# Patient Record
Sex: Female | Born: 1985 | Race: White | Hispanic: No | Marital: Single | Smoking: Never smoker
Health system: Southern US, Community
[De-identification: ages and names within clinical notes are randomized; demographics above are authoritative.]

## PROBLEM LIST (undated history)

## (undated) DIAGNOSIS — G809 Cerebral palsy, unspecified: Secondary | ICD-10-CM

## (undated) DIAGNOSIS — I10 Essential (primary) hypertension: Secondary | ICD-10-CM

## (undated) HISTORY — PX: BACK SURGERY: SHX140

---

## 2007-02-10 ENCOUNTER — Emergency Department: Payer: Self-pay | Admitting: Emergency Medicine

## 2007-08-16 ENCOUNTER — Emergency Department: Payer: Self-pay | Admitting: Emergency Medicine

## 2007-08-16 ENCOUNTER — Other Ambulatory Visit: Payer: Self-pay

## 2008-07-10 ENCOUNTER — Emergency Department: Payer: Self-pay | Admitting: Emergency Medicine

## 2008-10-03 ENCOUNTER — Encounter: Payer: Self-pay | Admitting: Internal Medicine

## 2008-10-25 ENCOUNTER — Encounter: Payer: Self-pay | Admitting: Internal Medicine

## 2008-11-25 ENCOUNTER — Encounter: Payer: Self-pay | Admitting: Internal Medicine

## 2010-08-18 ENCOUNTER — Emergency Department (HOSPITAL_COMMUNITY): Payer: Medicaid Other

## 2010-08-18 ENCOUNTER — Emergency Department (HOSPITAL_COMMUNITY)
Admission: EM | Admit: 2010-08-18 | Discharge: 2010-08-18 | Disposition: A | Discharge status: Home / Self Care | Payer: Medicaid Other | Attending: Emergency Medicine | Admitting: Emergency Medicine

## 2010-08-18 DIAGNOSIS — G809 Cerebral palsy, unspecified: Secondary | ICD-10-CM | POA: Insufficient documentation

## 2010-08-18 DIAGNOSIS — Y9289 Other specified places as the place of occurrence of the external cause: Secondary | ICD-10-CM | POA: Insufficient documentation

## 2010-08-18 DIAGNOSIS — S0990XA Unspecified injury of head, initial encounter: Secondary | ICD-10-CM | POA: Insufficient documentation

## 2010-08-18 DIAGNOSIS — M542 Cervicalgia: Secondary | ICD-10-CM | POA: Insufficient documentation

## 2010-08-18 DIAGNOSIS — R51 Headache: Secondary | ICD-10-CM | POA: Insufficient documentation

## 2010-08-18 DIAGNOSIS — W19XXXA Unspecified fall, initial encounter: Secondary | ICD-10-CM | POA: Insufficient documentation

## 2014-03-07 ENCOUNTER — Emergency Department: Payer: Self-pay | Admitting: Internal Medicine

## 2014-11-17 ENCOUNTER — Emergency Department
Admission: EM | Admit: 2014-11-17 | Discharge: 2014-11-17 | Disposition: A | Discharge status: Home / Self Care | Payer: Medicaid Other | Attending: Emergency Medicine | Admitting: Emergency Medicine

## 2014-11-17 DIAGNOSIS — R0981 Nasal congestion: Secondary | ICD-10-CM | POA: Diagnosis present

## 2014-11-17 DIAGNOSIS — J0100 Acute maxillary sinusitis, unspecified: Secondary | ICD-10-CM

## 2014-11-17 DIAGNOSIS — J029 Acute pharyngitis, unspecified: Secondary | ICD-10-CM | POA: Diagnosis not present

## 2014-11-17 DIAGNOSIS — H9203 Otalgia, bilateral: Secondary | ICD-10-CM | POA: Insufficient documentation

## 2014-11-17 MED ORDER — AMOXICILLIN-POT CLAVULANATE 600-42.9 MG/5ML PO SUSR: 30.0000 mg/kg/d | Freq: Two times a day (BID) | ORAL | Status: DC

## 2014-11-17 NOTE — Discharge Instructions (Signed)
Take medication as prescribed. Take over-the-counter Tylenol or ibuprofen as needed for pain or fever. Drink plenty of water.  Follow up with your primary care physician this week.  Return to the ER for new or worsening concerns  Pharyngitis Pharyngitis is redness, pain, and swelling (inflammation) of your pharynx.  CAUSES  Pharyngitis is usually caused by infection. Most of the time, these infections are from viruses (viral) and are part of a cold. However, sometimes pharyngitis is caused by bacteria (bacterial). Pharyngitis can also be caused by allergies. Viral pharyngitis may be spread from person to person by coughing, sneezing, and personal items or utensils (cups, forks, spoons, toothbrushes). Bacterial pharyngitis may be spread from person to person by more intimate contact, such as kissing.  SIGNS AND SYMPTOMS  Symptoms of pharyngitis include:   Sore throat.   Tiredness (fatigue).   Low-grade fever.   Headache.  Joint pain and muscle aches.  Skin rashes.  Swollen lymph nodes.  Plaque-like film on throat or tonsils (often seen with bacterial pharyngitis). DIAGNOSIS  Your health care provider will ask you questions about your illness and your symptoms. Your medical history, along with a physical exam, is often all that is needed to diagnose pharyngitis. Sometimes, a rapid strep test is done. Other lab tests may also be done, depending on the suspected cause.  TREATMENT  Viral pharyngitis will usually get better in 3-4 days without the use of medicine. Bacterial pharyngitis is treated with medicines that kill germs (antibiotics).  HOME CARE INSTRUCTIONS   Drink enough water and fluids to keep your urine clear or pale yellow.   Only take over-the-counter or prescription medicines as directed by your health care provider:   If you are prescribed antibiotics, make sure you finish them even if you start to feel better.   Do not take aspirin.   Get lots of rest.    Gargle with 8 oz of salt water ( tsp of salt per 1 qt of water) as often as every 1-2 hours to soothe your throat.   Throat lozenges (if you are not at risk for choking) or sprays may be used to soothe your throat. SEEK MEDICAL CARE IF:   You have large, tender lumps in your neck.  You have a rash.  You cough up green, yellow-brown, or bloody spit. SEEK IMMEDIATE MEDICAL CARE IF:   Your neck becomes stiff.  You drool or are unable to swallow liquids.  You vomit or are unable to keep medicines or liquids down.  You have severe pain that does not go away with the use of recommended medicines.  You have trouble breathing (not caused by a stuffy nose). MAKE SURE YOU:   Understand these instructions.  Will watch your condition.  Will get help right away if you are not doing well or get worse. Document Released: 06/13/2005 Document Revised: 04/03/2013 Document Reviewed: 02/18/2013 [facility] Patient Information 2015 [place], [place]. This information is not intended to replace advice given to you by your health care provider. Make sure you discuss any questions you have with your health care provider.  Sinusitis Sinusitis is redness, soreness, and inflammation of the paranasal sinuses. Paranasal sinuses are air pockets within the bones of your face (beneath the eyes, the middle of the forehead, or above the eyes). In healthy paranasal sinuses, mucus is able to drain out, and air is able to circulate through them by way of your nose. However, when your paranasal sinuses are inflamed, mucus and air can become  trapped. This can allow bacteria and other germs to grow and cause infection. Sinusitis can develop quickly and last only a short time (acute) or continue over a long period (chronic). Sinusitis that lasts for more than 12 weeks is considered chronic.  CAUSES  Causes of sinusitis include:  Allergies.  Structural abnormalities, such as displacement of the cartilage that  separates your nostrils (deviated septum), which can decrease the air flow through your nose and sinuses and affect sinus drainage.  Functional abnormalities, such as when the small hairs (cilia) that line your sinuses and help remove mucus do not work properly or are not present. SIGNS AND SYMPTOMS  Symptoms of acute and chronic sinusitis are the same. The primary symptoms are pain and pressure around the affected sinuses. Other symptoms include:  Upper toothache.  Earache.  Headache.  Bad breath.  Decreased sense of smell and taste.  A cough, which worsens when you are lying flat.  Fatigue.  Fever.  Thick drainage from your nose, which often is green and may contain pus (purulent).  Swelling and warmth over the affected sinuses. DIAGNOSIS  Your health care provider will perform a physical exam. During the exam, your health care provider may:  Look in your nose for signs of abnormal growths in your nostrils (nasal polyps).  Tap over the affected sinus to check for signs of infection.  View the inside of your sinuses (endoscopy) using an imaging device that has a light attached (endoscope). If your health care provider suspects that you have chronic sinusitis, one or more of the following tests may be recommended:  Allergy tests.  Nasal culture. A sample of mucus is taken from your nose, sent to a lab, and screened for bacteria.  Nasal cytology. A sample of mucus is taken from your nose and examined by your health care provider to determine if your sinusitis is related to an allergy. TREATMENT  Most cases of acute sinusitis are related to a viral infection and will resolve on their own within 10 days. Sometimes medicines are prescribed to help relieve symptoms (pain medicine, decongestants, nasal steroid sprays, or saline sprays).  However, for sinusitis related to a bacterial infection, your health care provider will prescribe antibiotic medicines. These are medicines that  will help kill the bacteria causing the infection.  Rarely, sinusitis is caused by a fungal infection. In theses cases, your health care provider will prescribe antifungal medicine. For some cases of chronic sinusitis, surgery is needed. Generally, these are cases in which sinusitis recurs more than 3 times per year, despite other treatments. HOME CARE INSTRUCTIONS   Drink plenty of water. Water helps thin the mucus so your sinuses can drain more easily.  Use a humidifier.  Inhale steam 3 to 4 times a day (for example, sit in the bathroom with the shower running).  Apply a warm, moist washcloth to your face 3 to 4 times a day, or as directed by your health care provider.  Use saline nasal sprays to help moisten and clean your sinuses.  Take medicines only as directed by your health care provider.  If you were prescribed either an antibiotic or antifungal medicine, finish it all even if you start to feel better. SEEK IMMEDIATE MEDICAL CARE IF:  You have increasing pain or severe headaches.  You have nausea, vomiting, or drowsiness.  You have swelling around your face.  You have vision problems.  You have a stiff neck.  You have difficulty breathing. MAKE SURE YOU:  Understand these instructions.  Will watch your condition.  Will get help right away if you are not doing well or get worse. Document Released: 06/13/2005 Document Revised: 10/28/2013 Document Reviewed: 06/28/2011 [facility] Patient Information 2015 [place], [place]. This information is not intended to replace advice given to you by your health care provider. Make sure you discuss any questions you have with your health care provider.

## 2014-11-17 NOTE — ED Provider Notes (Signed)
[facility] Department Provider Note ____________________________________________  Time seen: Approximately 6:27 PM  I have reviewed the triage vital signs and the nursing notes.   HISTORY  Chief Complaint URI   HPI Robin Hughes is a 29 y.o. female presents to the ER with mother at bedside for the complaints of runny nose and congestion for 2 weeks. Patient states that she's also has bilateral ear pain and sore throat for 2-3 days. Patient states that she has a lot of green nasal discharge. Reports sinus pressure and pain. Denies fever. Reports mild intermittent cough. Denies shortness of breath or chest pain. Reports continues to eat and drink well. Denies abdominal pain. Denies nausea or vomiting, diarrhea. Reports niece recently with strep throat.   Medical History: Cerebral Palsy  PCP: [facility]  There are no active problems to display for this patient.  No past surgical history on file.  No current outpatient prescriptions on file.  Allergies Review of patient's allergies indicates no known allergies.  No family history on file.  Social History History  Substance Use Topics  . Smoking status: Not on file  . Smokeless tobacco: Not on file  . Alcohol Use: Not on file    Review of Systems Constitutional: No fever/chills Eyes: No visual changes. ENT: positive for sore throat. Cardiovascular: Denies chest pain. Respiratory: Denies shortness of breath. Gastrointestinal: No abdominal pain.  No nausea, no vomiting.  No diarrhea.  No constipation. Genitourinary: Negative for dysuria. Musculoskeletal: Negative for back pain. Skin: Negative for rash. Neurological: Negative for headaches, focal weakness or numbness.  10-point ROS otherwise negative.  ____________________________________________   PHYSICAL EXAM:  VITAL SIGNS: ED Triage Vitals  Enc Vitals Group     BP 11/17/14 1612 116/85 mmHg     Pulse Rate 11/17/14 1612 69   Resp 11/17/14 1612 20     Temp 11/17/14 1612 98.2 F (36.8 C)     Temp Source 11/17/14 1612 Oral     SpO2 11/17/14 1612 100 %     Weight 11/17/14 1612 120 lb (54.432 kg)     Height 11/17/14 1612 4\' 9"  (1.448 m)     Head Cir --      Peak Flow --      Pain Score 11/17/14 1613 8     Pain Loc --      Pain Edu? --      Excl. in GC? --     Constitutional: Alert and oriented. Well appearing and in no acute distress. Eyes: Conjunctivae are normal. PERRL. EOMI. Head: ColumbusDryCleaner.[email or web address].mild to mod TTP maxillary and frontal sinus  Ears: No erythema. Bilateral TMs normal. No exudate. Nose:rhinorrhea, mucosal inflammation Mouth/Throat: Mucous membranes are moist.  Mild pharyngeal erythema with mild exudate bilaterally. Mild tonsillar swelling. NO uvular shift or deviation. Neck: No stridor.  No cervical spine tenderness to palpation. Hematological/Lymphatic/Immunilogical: mild cervical lymphadenopathy. Cardiovascular: Normal rate, regular rhythm. Grossly normal heart sounds.  Good peripheral circulation. Respiratory: Normal respiratory effort.  No retractions. Lungs CTAB. Gastrointestinal: Soft and nontender. No distention. No abdominal bruits. No CVA tenderness. Musculoskeletal: No lower extremity tenderness nor edema.  No joint effusions. Neurologic:  Normal speech and language. No gross focal neurologic deficits are appreciated. Speech is normal. No gait instability. Skin:  Skin is warm, dry and intact. No rash noted. Psychiatric: Mood and affect are normal. Speech and behavior are normal.  ____________________________________________   ________________________________________   INITIAL IMPRESSION / ASSESSMENT AND PLAN / ED COURSE  Pertinent labs & imaging  results that were available during my care of the patient were reviewed by me and considered in my medical decision making (see chart for details).  No acute distress. Very well-appearing patient. Reports sinus congestion nasal discharge 2  weeks now with also sore throat and bilateral ear pain. Suspect bacterial sinusitis and streptococcal pharyngitis. Recent exposure to strep throat from niece. Will treat with oral Augmentin. Patient to follow with the primary care physician this week. Discussed and drink plenty of fluids. Discussed follow-up and return parameters. Patient and mother agreed to plan. ____________________________________________   FINAL CLINICAL IMPRESSION(S) / ED DIAGNOSES  Final diagnoses:  Acute maxillary sinusitis, recurrence not specified  Pharyngitis     Renford Dills, NP 11/17/14 1841  Minna Antis, MD 11/17/14 2356

## 2014-11-17 NOTE — ED Notes (Signed)
Cold sx;'s for 2 weeks

## 2015-11-27 ENCOUNTER — Emergency Department
Admission: EM | Admit: 2015-11-27 | Discharge: 2015-11-27 | Disposition: A | Discharge status: Home / Self Care | Payer: Medicaid Other | Attending: Emergency Medicine | Admitting: Emergency Medicine

## 2015-11-27 ENCOUNTER — Emergency Department: Payer: Medicaid Other

## 2015-11-27 ENCOUNTER — Encounter: Payer: Self-pay | Admitting: Emergency Medicine

## 2015-11-27 DIAGNOSIS — Y9389 Activity, other specified: Secondary | ICD-10-CM | POA: Diagnosis not present

## 2015-11-27 DIAGNOSIS — Y990 Civilian activity done for income or pay: Secondary | ICD-10-CM | POA: Diagnosis not present

## 2015-11-27 DIAGNOSIS — M25562 Pain in left knee: Secondary | ICD-10-CM | POA: Diagnosis present

## 2015-11-27 DIAGNOSIS — S81032A Puncture wound without foreign body, left knee, initial encounter: Secondary | ICD-10-CM | POA: Insufficient documentation

## 2015-11-27 DIAGNOSIS — W228XXA Striking against or struck by other objects, initial encounter: Secondary | ICD-10-CM | POA: Insufficient documentation

## 2015-11-27 DIAGNOSIS — G809 Cerebral palsy, unspecified: Secondary | ICD-10-CM | POA: Insufficient documentation

## 2015-11-27 DIAGNOSIS — Y92019 Unspecified place in single-family (private) house as the place of occurrence of the external cause: Secondary | ICD-10-CM | POA: Diagnosis not present

## 2015-11-27 DIAGNOSIS — G2581 Restless legs syndrome: Secondary | ICD-10-CM | POA: Insufficient documentation

## 2015-11-27 MED ORDER — HYDROCODONE-ACETAMINOPHEN 5-325 MG PO TABS
1.0000 | ORAL_TABLET | Freq: Once | ORAL | Status: AC
  Administered 2015-11-27: 1 via ORAL
  Filled 2015-11-27: qty 1

## 2015-11-27 MED ORDER — AMOXICILLIN-POT CLAVULANATE 600-42.9 MG/5ML PO SUSR: 30.0000 mg/kg/d | Freq: Two times a day (BID) | ORAL | Status: DC

## 2015-11-27 MED ORDER — FLUCONAZOLE 150 MG PO TABS: 150.0000 mg | ORAL_TABLET | Freq: Every day | ORAL | Status: DC

## 2015-11-27 MED ORDER — HYDROCODONE-ACETAMINOPHEN 7.5-325 MG/15ML PO SOLN: 10.0000 mL | Freq: Four times a day (QID) | ORAL | Status: DC | PRN

## 2015-11-27 NOTE — Discharge Instructions (Signed)
Puncture Wound A puncture wound is an injury that is caused by a sharp, thin object that goes through (penetrates) your skin. Usually, a puncture wound does not leave a large opening in your skin, so it may not bleed a lot. However, when you get a puncture wound, dirt or other materials (foreign bodies) can be forced into your wound and break off inside. This increases the chance of infection, such as tetanus. CAUSES Puncture wounds are caused by any sharp, thin object that goes through your skin, such as:  Animal teeth, as with an animal bite.  Sharp, pointed objects, such as nails, splinters of glass, fishhooks, and needles. SYMPTOMS Symptoms of a puncture wound include:  Pain.  Bleeding.  Swelling.  Bruising.  Fluid leaking from the wound.  Numbness, tingling, or loss of function. DIAGNOSIS This condition is diagnosed with a medical history and physical exam. Your wound will be checked to see if it contains any foreign bodies. You may also have X-rays or other imaging tests. TREATMENT Treatment for a puncture wound depends on how serious the wound is. It also depends on whether the wound contains any foreign bodies. Treatment for all types of puncture wounds usually starts with:  Controlling the bleeding.  Washing out the wound with a germ-free (sterile) salt-water solution.  Checking the wound for foreign bodies. Treatment may also include:  Having the wound opened surgically to remove a foreign object.  Closing the wound with stitches (sutures) if it continues to bleed.  Covering the wound with antibiotic ointments and a bandage (dressing).  Receiving a tetanus shot.  Receiving a rabies vaccine. HOME CARE INSTRUCTIONS Medicines  Take or apply over-the-counter and prescription medicines only as told by your health care provider.  If you were prescribed an antibiotic, take or apply it as told by your health care provider. Do not stop using the antibiotic even if  your condition improves. Wound Care  There are many ways to close and cover a wound. For example, a wound can be covered with sutures, skin glue, or adhesive strips. Follow instructions from your health care provider about:  How to take care of your wound.  When and how you should change your dressing.  When you should remove your dressing.  Removing whatever was used to close your wound.  Keep the dressing dry as told by your health care provider. Do not take baths, swim, use a hot tub, or do anything that would put your wound underwater until your health care provider approves.  Clean the wound as told by your health care provider.  Do not scratch or pick at the wound.  Check your wound every day for signs of infection. Watch for:  Redness, swelling, or pain.  Fluid, blood, or pus. General Instructions  Raise (elevate) the injured area above the level of your heart while you are sitting or lying down.  If your puncture wound is in your foot, ask your health care provider if you need to avoid putting weight on your foot and for how long.  Keep all follow-up visits as told by your health care provider. This is important. SEEK MEDICAL CARE IF:  You received a tetanus shot and you have swelling, severe pain, redness, or bleeding at the injection site.  You have a fever.  Your sutures come out.  You notice a bad smell coming from your wound or your dressing.  You notice something coming out of your wound, such as wood or glass.  Your   pain is not controlled with medicine.  You have increased redness, swelling, or pain at the site of your wound.  You have fluid, blood, or pus coming from your wound.  You notice a change in the color of your skin near your wound.  You need to change the dressing frequently due to fluid, blood, or pus draining from your wound.  You develop a new rash.  You develop numbness around your wound. SEEK IMMEDIATE MEDICAL CARE IF:  You  develop severe swelling around your wound.  Your pain suddenly increases and is severe.  You develop painful skin lumps.  You have a red streak going away from your wound.  The wound is on your hand or foot and you cannot properly move a finger or toe.  The wound is on your hand or foot and you notice that your fingers or toes look pale or bluish.   This information is not intended to replace advice given to you by your health care provider. Make sure you discuss any questions you have with your health care provider.   Document Released: 03/23/2005 Document Revised: 03/04/2015 Document Reviewed: 08/06/2014 Elsevier Interactive Patient Education 2016 [employer]  

## 2015-11-27 NOTE — ED Provider Notes (Signed)
[facility] Emergency Department Provider Note ____________________________________________  Time seen: Approximately 6:03 PM  I have reviewed the triage vital signs and the nursing notes.   HISTORY  Chief Complaint Knee Pain    HPI Robin Hughes is a 30 y.o. female who presents to the emergency department for evaluation of left knee pain. Due to cerebral palsy, she crawls most of the time. Carpet was recently removed from her room and a carpet staple was left on the floor and she crawled over it. They pulled the staple out, but the knee has been swelling and becoming very painful. Incident happened 3 days ago.  History reviewed. No pertinent past medical history.  Patient Active Problem List   Diagnosis Date Noted  . Cerebral palsy (HCC) 11/27/2015  . Restless leg syndrome 11/27/2015    History reviewed. No pertinent past surgical history.  Current Outpatient Rx  Name  Route  Sig  Dispense  Refill  . gabapentin (NEURONTIN) 100 MG capsule   Oral   Take 100 mg by mouth at bedtime.         . sertraline (ZOLOFT) 25 MG tablet   Oral   Take 25 mg by mouth daily.         Marland Kitchen. amoxicillin-clavulanate (AUGMENTIN ES-600) 600-42.9 MG/5ML suspension   Oral   Take 7.1 mLs (852 mg total) by mouth 2 (two) times daily.   200 mL   0   . fluconazole (DIFLUCAN) 150 MG tablet   Oral   Take 1 tablet (150 mg total) by mouth daily.   1 tablet   0   . HYDROcodone-acetaminophen (HYCET) 7.5-325 mg/15 ml solution   Oral   Take 10 mLs by mouth 4 (four) times daily as needed for moderate pain.   60 mL   0     Allergies Review of patient's allergies indicates no known allergies.  No family history on file.  Social History Social History  Substance Use Topics  . Smoking status: Never Smoker   . Smokeless tobacco: None  . Alcohol Use: No    Review of Systems Constitutional: No recent illness. Cardiovascular: Denies chest pain or  palpitations. Respiratory: Denies shortness of breath. Musculoskeletal: Pain in left knee Skin: Negative for rash, wound, lesion. Neurological: Negative for focal weakness or numbness.  ____________________________________________   PHYSICAL EXAM:  VITAL SIGNS: ED Triage Vitals  Enc Vitals Group     BP 11/27/15 1754 138/88 mmHg     Pulse Rate 11/27/15 1754 90     Resp 11/27/15 1754 20     Temp 11/27/15 1754 99.1 F (37.3 C)     Temp Source 11/27/15 1754 Oral     SpO2 11/27/15 1754 98 %     Weight 11/27/15 1754 125 lb (56.7 kg)     Height 11/27/15 1754 4\' 8"  (1.422 m)     Head Cir --      Peak Flow --      Pain Score 11/27/15 1750 4     Pain Loc --      Pain Edu? --      Excl. in GC? --     Constitutional: Alert and oriented. Well appearing and in no acute distress. Eyes: Conjunctivae are normal. EOMI. Head: Atraumatic. Neck: No stridor.  Respiratory: Normal respiratory effort.   Musculoskeletal: ROM of left knee is at baseline per patient. No erythema or increase in skin temp compared to the other knee.  Neurologic:  Normal speech and language. No  gross focal neurologic deficits are appreciated. Speech is normal. No gait instability. Skin:  Skin is warm, dry and intact. Atraumatic. Psychiatric: Mood and affect are normal. Speech and behavior are normal.  ____________________________________________   LABS (all labs ordered are listed, but only abnormal results are displayed)  Labs Reviewed - No data to display ____________________________________________  RADIOLOGY  Anterior soft tissue swelling with no bony abnormality or radiopaque foreign body of the left knee. There is no joint effusion. ____________________________________________   PROCEDURES  Procedure(s) performed:  Ace bandage applied by ER tech. Neurovascularly intact after application.   ____________________________________________   INITIAL IMPRESSION / ASSESSMENT AND PLAN / ED  COURSE  Pertinent labs & imaging results that were available during my care of the patient were reviewed by me and considered in my medical decision making (see chart for details).  Patient was encouraged to take the Augmentin until finished. She was instructed to follow up with her primary care provider for symptoms that are not improving over the next few days. She was instructed to return to the emergency department for symptoms that change or worsen if she is unable schedule an appointment. ____________________________________________   FINAL CLINICAL IMPRESSION(S) / ED DIAGNOSES  Final diagnoses:  Puncture wound of knee, left, initial encounter       Chinita Pester, FNP 11/28/15 0013  Loleta Rose, MD 11/29/15 1551

## 2015-11-27 NOTE — ED Notes (Signed)
Per family they are doing some remodeling to house .  she was on the floor and may have gottne a staple in left knee  having pain and some swelling to knee

## 2016-01-14 ENCOUNTER — Encounter: Payer: Self-pay | Admitting: Emergency Medicine

## 2016-01-14 ENCOUNTER — Emergency Department
Admission: EM | Admit: 2016-01-14 | Discharge: 2016-01-14 | Disposition: A | Discharge status: Home / Self Care | Payer: Medicaid Other | Attending: Emergency Medicine | Admitting: Emergency Medicine

## 2016-01-14 ENCOUNTER — Emergency Department: Payer: Medicaid Other

## 2016-01-14 DIAGNOSIS — M7052 Other bursitis of knee, left knee: Secondary | ICD-10-CM | POA: Diagnosis not present

## 2016-01-14 DIAGNOSIS — Z79899 Other long term (current) drug therapy: Secondary | ICD-10-CM | POA: Diagnosis not present

## 2016-01-14 DIAGNOSIS — Y939 Activity, unspecified: Secondary | ICD-10-CM | POA: Insufficient documentation

## 2016-01-14 DIAGNOSIS — M7989 Other specified soft tissue disorders: Secondary | ICD-10-CM | POA: Diagnosis present

## 2016-01-14 MED ORDER — MELOXICAM 15 MG PO TABS: 15.0000 mg | ORAL_TABLET | Freq: Every day | ORAL | Status: DC

## 2016-01-14 NOTE — ED Notes (Signed)
Developed swelling to left knee  About 1 1/2 weeks ago  Denies recent injury

## 2016-01-14 NOTE — ED Provider Notes (Signed)
[facility] Emergency Department Provider Note  ____________________________________________  Time seen: Approximately 3:38 PM  I have reviewed the triage vital signs and the nursing notes.   HISTORY  Chief Complaint Joint Swelling    HPI Robin Hughes is a 30 y.o. female who presents emergency department for complaint of left knee swelling. The patient was seen in this department approximately a month and a half ago after an injury to the knee on which patient accidentally placed her knee down on a staple. Patient was treated with antibiotics and pain medication at that time had had a good resolution of symptoms. Per the patient knee swelling began approximately 2-3 weeks after this injury and has continued since. Patient does have cerebral palsy and spends a lot of time ambulating on her knees. Patient has had multiple bursal drainages over the years. Patient reports swelling consistent with previous knee effusions.   History reviewed. No pertinent past medical history.  Patient Active Problem List   Diagnosis Date Noted  . Cerebral palsy (HCC) 11/27/2015  . Restless leg syndrome 11/27/2015    History reviewed. No pertinent past surgical history.  Current Outpatient Rx  Name  Route  Sig  Dispense  Refill  . amoxicillin-clavulanate (AUGMENTIN ES-600) 600-42.9 MG/5ML suspension   Oral   Take 7.1 mLs (852 mg total) by mouth 2 (two) times daily.   200 mL   0   . fluconazole (DIFLUCAN) 150 MG tablet   Oral   Take 1 tablet (150 mg total) by mouth daily.   1 tablet   0   . gabapentin (NEURONTIN) 100 MG capsule   Oral   Take 100 mg by mouth at bedtime.         Marland Kitchen. HYDROcodone-acetaminophen (HYCET) 7.5-325 mg/15 ml solution   Oral   Take 10 mLs by mouth 4 (four) times daily as needed for moderate pain.   60 mL   0   . meloxicam (MOBIC) 15 MG tablet   Oral   Take 1 tablet (15 mg total) by mouth daily.   30 tablet   0   . sertraline  (ZOLOFT) 25 MG tablet   Oral   Take 25 mg by mouth daily.           Allergies Review of patient's allergies indicates no known allergies.  No family history on file.  Social History Social History  Substance Use Topics  . Smoking status: Never Smoker   . Smokeless tobacco: None  . Alcohol Use: No     Review of Systems  Constitutional: No fever/chills Cardiovascular: no chest pain. Respiratory: no cough. No SOB. Musculoskeletal: Positive for left knee swelling Skin: Negative for rash, abrasions, lacerations, ecchymosis. Neurological: Negative for headaches, focal weakness or numbness. 10-point ROS otherwise negative.  ____________________________________________   PHYSICAL EXAM:  VITAL SIGNS: ED Triage Vitals  Enc Vitals Group     BP 01/14/16 1359 113/84 mmHg     Pulse Rate 01/14/16 1359 82     Resp 01/14/16 1359 20     Temp 01/14/16 1359 99.1 F (37.3 C)     Temp Source 01/14/16 1359 Oral     SpO2 01/14/16 1359 100 %     Weight 01/14/16 1359 125 lb (56.7 kg)     Height 01/14/16 1359 4\' 11"  (1.499 m)     Head Cir --      Peak Flow --      Pain Score 01/14/16 1400 4     Pain  Loc --      Pain Edu? --      Excl. in GC? --      Constitutional: Alert and oriented. Well appearing and in no acute distress. Eyes: Conjunctivae are normal. PERRL. EOMI. Head: Atraumatic. Cardiovascular: Normal rate, regular rhythm. Normal S1 and S2.  Good peripheral circulation. Respiratory: Normal respiratory effort without tachypnea or retractions. Lungs CTAB. Good air entry to the bases with no decreased or absent breath sounds. Musculoskeletal: Full range of motion to all extremities. No gross deformities appreciated.Large knee effusion is appreciated to the left knee. Positive ballottement sign to lateral and medial knee as well as prepatellar region. Patient has good range of motion to knee with assistance. Varus, valgus, Lachman's is negative. Area is not warm to palpation. No  overlying erythema of the skin. Neurologic:  Normal speech and language. No gross focal neurologic deficits are appreciated.  Skin:  Skin is warm, dry and intact. No rash noted. Psychiatric: Mood and affect are normal. Speech and behavior are normal. Patient exhibits appropriate insight and judgement.   ____________________________________________   LABS (all labs ordered are listed, but only abnormal results are displayed)  Labs Reviewed - No data to display ____________________________________________  EKG   ____________________________________________  RADIOLOGY Festus Barren Relda Agosto, personally viewed and evaluated these images (plain radiographs) as part of my medical decision making, as well as reviewing the written report by the radiologist.  Dg Knee Complete 4 Views Left  01/14/2016  CLINICAL DATA:  Left knee swelling 2 weeks.  No injury. EXAM: LEFT KNEE - COMPLETE 4+ VIEW COMPARISON:  11/27/2015 FINDINGS: Examination demonstrates extensive soft tissue swelling over the prepatellar region suggesting a prepatellar bursitis. No fracture dislocation. No significant joint effusion. IMPRESSION: Significant soft tissue prominence of the prepatellar region suggesting a prepatellar bursitis. Electronically Signed   By: Elberta Fortis M.D.   On: 01/14/2016 15:28    ____________________________________________    PROCEDURES  Procedure(s) performed:       Medications - No data to display   ____________________________________________   INITIAL IMPRESSION / ASSESSMENT AND PLAN / ED COURSE  Pertinent labs & imaging results that were available during my care of the patient were reviewed by me and considered in my medical decision making (see chart for details).  Patient's diagnosis is consistent with bursitis to the left knee. There is a positive effusion from physical exam. This appears to encompass both pre-and post-patellar regions. Patient has had multiple knee  effusions with drainage in the past due to her ambulation on knees. Due to a month plus history, history of recurring knee effusions, and with significant effusion to multiple regions of knee, this will be referred to orthopedic surgery for drainage. No signs of infection at this time  warranting antibiotic medications.. Patient will be discharged home with prescriptions for anti-inflammatories for symptom control. Patient's knee is wrapped with an Ace bandage.. Patient is to follow up with orthopedics for further evaluation and treatment. Patient is given ED precautions to return to the ED for any worsening or new symptoms.     ____________________________________________  FINAL CLINICAL IMPRESSION(S) / ED DIAGNOSES  Final diagnoses:  Bursitis, knee, left      NEW MEDICATIONS STARTED DURING THIS VISIT:  New Prescriptions   MELOXICAM (MOBIC) 15 MG TABLET    Take 1 tablet (15 mg total) by mouth daily.        This chart was dictated using voice recognition software/Dragon. Despite best efforts to proofread, errors can occur which  can change the meaning. Any change was purely unintentional.    Racheal Patches, PA-C 01/14/16 1557  Nita Sickle, MD 01/14/16 2330

## 2016-01-14 NOTE — ED Notes (Signed)
See triage note   Developed more swelling to left knee over the past week or so  Denies any injury area is non tender with palpation

## 2016-01-14 NOTE — Discharge Instructions (Signed)
Bursitis °Bursitis is inflammation and irritation of a bursa, which is one of the small, fluid-filled sacs that cushion and protect the moving parts of your body. These sacs are located between bones and muscles, muscle attachments, or skin areas next to bones. A bursa protects these structures from the wear and tear that results from frequent movement. °An inflamed bursa causes pain and swelling. Fluid may build up inside the sac. Bursitis is most common near joints, especially the knees, elbows, hips, and shoulders. °CAUSES °Bursitis can be caused by:  °· Injury from: °¨ A direct blow, like falling on your knee or elbow. °¨ Overuse of a joint (repetitive stress). °· Infection. This can happen if bacteria gets into a bursa through a cut or scrape near a joint. °· Diseases that cause joint inflammation, such as gout and rheumatoid arthritis. °RISK FACTORS °You may be at risk for bursitis if you:  °· Have a job or hobby that involves a lot of repetitive stress on your joints. °· Have a condition that weakens your body's defense system (immune system), such as diabetes, cancer, or HIV. °· Lift and reach overhead often. °· Kneel or lean on hard surfaces often. °· Run or walk often. °SIGNS AND SYMPTOMS °The most common signs and symptoms of bursitis are: °· Pain that gets worse when you move the affected body part or put weight on it. °· Inflammation. °· Stiffness. °Other signs and symptoms may include: °· Redness. °· Tenderness. °· Warmth. °· Pain that continues after rest. °· Fever and chills. This may occur in bursitis caused by infection. °DIAGNOSIS °Bursitis may be diagnosed by:  °· Medical history and physical exam. °· MRI. °· A procedure to drain fluid from the bursa with a needle (aspiration). The fluid may be checked for signs of infection or gout. °· Blood tests to rule out other causes of inflammation. °TREATMENT  °Bursitis can usually be treated at home with rest, ice, compression, and elevation (RICE). For  mild bursitis, RICE treatment may be all you need. Other treatments may include: °· Nonsteroidal anti-inflammatory drugs (NSAIDs) to treat pain and inflammation. °· Corticosteroids to fight inflammation. You may have these drugs injected into and around the area of bursitis. °· Aspiration of bursitis fluid to relieve pain and improve movement. °· Antibiotic medicine to treat an infected bursa. °· A splint, brace, or walking aid. °· Physical therapy if you continue to have pain or limited movement. °· Surgery to remove a damaged or infected bursa. This may be needed if you have a very bad case of bursitis or if other treatments have not worked. °HOME CARE INSTRUCTIONS  °· Take medicines only as directed by your health care provider. °· If you were prescribed an antibiotic medicine, finish it all even if you start to feel better. °· Rest the affected area as directed by your health care provider. °¨ Keep the area elevated. °¨ Avoid activities that make pain worse. °· Apply ice to the injured area: °¨ Place ice in a plastic bag. °¨ Place a towel between your skin and the bag. °¨ Leave the ice on for 20 minutes, 2-3 times a day. °· Use splints, braces, pads, or walking aids as directed by your health care provider. °· Keep all follow-up visits as directed by your health care provider. This is important. °PREVENTION  °· Wear knee pads if you kneel often. °· Wear sturdy running or walking shoes that fit you well. °· Take regular breaks from repetitive activity. °· Warm   up by stretching before doing any strenuous activity.  Maintain a healthy weight or lose weight as recommended by your health care provider. Ask your health care provider if you need help.  Exercise regularly. Start any new physical activity gradually. SEEK MEDICAL CARE IF:   Your bursitis is not responding to treatment or home care.  You have a fever.  You have chills.   This information is not intended to replace advice given to you by your  health care provider. Make sure you discuss any questions you have with your health care provider.   Document Released: 06/10/2000 Document Revised: 03/04/2015 Document Reviewed: 09/02/2013 Elsevier Interactive Patient Education 2016 Elsevier Inc. Knee Effusion Knee effusion means that you have excess fluid in your knee joint. This can cause pain and swelling in your knee. This may make your knee more difficult to bend and move. That is because there is increased pain and pressure in the joint. If there is fluid in your knee, it often means that something is wrong inside your knee, such as severe arthritis, abnormal inflammation, or an infection. Another common cause of knee effusion is an injury to the knee muscles, ligaments, or cartilage. HOME CARE INSTRUCTIONS  Use crutches as directed by your health care provider.  Wear a knee brace as directed by your health care provider.  Apply ice to the swollen area:  Put ice in a plastic bag.  Place a towel between your skin and the bag.  Leave the ice on for 20 minutes, 2-3 times per day.  Keep your knee raised (elevated) when you are sitting or lying down.  Take medicines only as directed by your health care provider.  Do any rehabilitation or strengthening exercises as directed by your health care provider.  Rest your knee as directed by your health care provider. You may start doing your normal activities again when your health care provider approves.   Keep all follow-up visits as directed by your health care provider. This is important. SEEK MEDICAL CARE IF:  You have ongoing (persistent) pain in your knee. SEEK IMMEDIATE MEDICAL CARE IF:  You have increased swelling or redness of your knee.  You have severe pain in your knee.  You have a fever.   This information is not intended to replace advice given to you by your health care provider. Make sure you discuss any questions you have with your health care provider.     Document Released: 09/03/2003 Document Revised: 07/04/2014 Document Reviewed: 01/27/2014 Elsevier Interactive Patient Education [employer]2016 Elsevier Inc[employer]

## 2016-01-25 ENCOUNTER — Other Ambulatory Visit
Admission: RE | Admit: 2016-01-25 | Discharge: 2016-01-25 | Disposition: A | Discharge status: Home / Self Care | Payer: Medicaid Other | Source: Ambulatory Visit | Attending: Specialist | Admitting: Specialist

## 2016-01-25 DIAGNOSIS — M25462 Effusion, left knee: Secondary | ICD-10-CM | POA: Insufficient documentation

## 2016-01-29 LAB — BODY FLUID CULTURE: CULTURE: NO GROWTH

## 2016-11-28 ENCOUNTER — Encounter: Payer: Self-pay | Admitting: Emergency Medicine

## 2016-11-28 ENCOUNTER — Emergency Department
Admission: EM | Admit: 2016-11-28 | Discharge: 2016-11-28 | Disposition: A | Discharge status: Home / Self Care | Payer: Medicaid Other | Attending: Emergency Medicine | Admitting: Emergency Medicine

## 2016-11-28 DIAGNOSIS — H6690 Otitis media, unspecified, unspecified ear: Secondary | ICD-10-CM

## 2016-11-28 DIAGNOSIS — H6692 Otitis media, unspecified, left ear: Secondary | ICD-10-CM | POA: Insufficient documentation

## 2016-11-28 DIAGNOSIS — H9202 Otalgia, left ear: Secondary | ICD-10-CM | POA: Diagnosis present

## 2016-11-28 HISTORY — DX: Cerebral palsy, unspecified: G80.9

## 2016-11-28 MED ORDER — HYDROCODONE-ACETAMINOPHEN 5-325 MG PO TABS
1.0000 | ORAL_TABLET | Freq: Once | ORAL | Status: AC
  Administered 2016-11-28: 1 via ORAL
  Filled 2016-11-28: qty 1

## 2016-11-28 MED ORDER — AMOXICILLIN 400 MG/5ML PO SUSR: 1000.0000 mg | Freq: Two times a day (BID) | ORAL | 0 refills | Status: DC

## 2016-11-28 MED ORDER — CARBAMIDE PEROXIDE 6.5 % OT SOLN
5.0000 [drp] | Freq: Once | OTIC | Status: AC
  Administered 2016-11-28: 5 [drp] via OTIC
  Filled 2016-11-28: qty 15

## 2016-11-28 MED ORDER — TRAMADOL HCL 50 MG PO TABS: 50.0000 mg | ORAL_TABLET | Freq: Four times a day (QID) | ORAL | 0 refills | Status: DC | PRN

## 2016-11-28 MED ORDER — FLUCONAZOLE 150 MG PO TABS: 150.0000 mg | ORAL_TABLET | Freq: Every day | ORAL | 0 refills | Status: DC

## 2016-11-28 NOTE — ED Triage Notes (Signed)
Left ear pain x 1 day

## 2016-11-28 NOTE — ED Provider Notes (Signed)
[facility] Emergency Department Provider Note ____________________________________________  Time seen: Approximately 5:36 PM  I have reviewed the triage vital signs and the nursing notes.   HISTORY  Chief Complaint Otalgia    HPI Robin Hughes is a 31 y.o. female who presents to the emergency department for evaluation of left ear pain has been present over the past day. Mother states that they went to the beach over the weekend and she swam in the pool. Mother states that she has a history of ear infections after swimming. She is taking ibuprofen without relief of pain.  Past Medical History:  Diagnosis Date  . CP (cerebral palsy) [facility](HCC)     Patient Active Problem List   Diagnosis Date Noted  . Cerebral palsy (HCC) 11/27/2015  . Restless leg syndrome 11/27/2015    History reviewed. No pertinent surgical history.  Prior to Admission medications   Medication Sig Start Date End Date Taking? Authorizing Provider  amoxicillin (AMOXIL) 400 MG/5ML suspension Take 12.5 mLs (1,000 mg total) by mouth 2 (two) times daily. 11/28/16   Vladislav Axelson, Rulon Eisenmengerari B, FNP  fluconazole (DIFLUCAN) 150 MG tablet Take 1 tablet (150 mg total) by mouth daily. 11/28/16   Twania Bujak B, FNP  gabapentin (NEURONTIN) 100 MG capsule Take 100 mg by mouth at bedtime.    [provider]  HYDROcodone-acetaminophen (HYCET) 7.5-325 mg/15 ml solution Take 10 mLs by mouth 4 (four) times daily as needed for moderate pain. 11/27/15   Addylin Manke, Rulon Eisenmengerari B, FNP  meloxicam (MOBIC) 15 MG tablet Take 1 tablet (15 mg total) by mouth daily. 01/14/16   Cuthriell, Delorise RoyalsJonathan D, PA-C  sertraline (ZOLOFT) 25 MG tablet Take 25 mg by mouth daily.    [provider]  traMADol (ULTRAM) 50 MG tablet Take 1 tablet (50 mg total) by mouth every 6 (six) hours as needed. 11/28/16   Chinita Pesterriplett, Joesiah Lonon B, FNP    Allergies Patient has no known allergies.  No family history on file.  Social History Social History   Substance Use Topics  . Smoking status: Never Smoker  . Smokeless tobacco: Never Used  . Alcohol use No    Review of Systems Constitutional: Negative for fever Eyes: Negative for conjunctival erythema or discharge ENT: Positive for left ear pain  Gastrointestinal: Negative for vomiting or diarrhea Musculoskeletal: Negative for bodyaches Skin: Negative for rash, lesion, wound Neurological: Negative for dizziness or imbalance. ____________________________________________   PHYSICAL EXAM:  VITAL SIGNS: ED Triage Vitals  Enc Vitals Group     BP 11/28/16 1701 117/71     Pulse Rate 11/28/16 1701 66     Resp 11/28/16 1701 16     Temp 11/28/16 1701 99.1 F (37.3 C)     Temp Source 11/28/16 1701 Oral     SpO2 11/28/16 1701 100 %     Weight --      Height --      Head Circumference --      Peak Flow --      Pain Score 11/28/16 1704 8     Pain Loc --      Pain Edu? --      Excl. in GC? --     Constitutional: No fever. Patient appears uncomfortable. Eyes: Conjunctiva are clear Ears: Cerumen impaction of the left ear prevents visualization of the tympanic membrane. Right tympanic membrane is normal.  Head: Atraumatic Nose: No rhinorrhea noted Mouth/Throat: Mucous membranes are moist. No pharyngeal erythema Hematological/Lymphatic/Immunilogical: No preauricular or anterior cervical nodes  palpable. Cardiovascular: Heart rate regular without obvious murmurs, rubs, or gallops Respiratory: Respirations even and unlabored. Breath sounds clear to auscultation throughout.  Neurologic:  At baseline Skin: Warm, dry, without rash, lesion, or wound. ____________________________________________   LABS (all labs ordered are listed, but only abnormal results are displayed)  Labs Reviewed - No data to display ____________________________________________   RADIOLOGY  Not indicated ____________________________________________   PROCEDURES  Procedure(s) performed: Cerumen  disimpaction  ____________________________________________   INITIAL IMPRESSION / ASSESSMENT AND PLAN / ED COURSE  31 year old female presenting to the emergency department for evaluation of left ear pain has been present for the past 24 hours.  18:50:  Partial disimpaction of cerumen allowed visualization of a portion of the left TM which appears inflamed, dull, and red. She will be given a prescription for amoxicillin and Diflucan. She was encouraged to use the Debrox prior to showering at least once a week. She was encouraged not to use Q-tips. She was encouraged to follow up with her primary care provider for symptoms are not improving over the next few days. She was encouraged to return to the emergency department for symptoms that change or worsen if she is unable schedule an appointment.  Pertinent labs & imaging results that were available during my care of the patient were reviewed by me and considered in my medical decision making (see chart for details). ____________________________________________   FINAL CLINICAL IMPRESSION(S) / ED DIAGNOSES  Final diagnoses:  Acute otitis media, unspecified otitis media type    Discharge Medication List as of 11/28/2016  6:54 PM    START taking these medications   Details  amoxicillin (AMOXIL) 400 MG/5ML suspension Take 12.5 mLs (1,000 mg total) by mouth 2 (two) times daily., Starting Mon 11/28/2016, Print    traMADol (ULTRAM) 50 MG tablet Take 1 tablet (50 mg total) by mouth every 6 (six) hours as needed., Starting Mon 11/28/2016, Print        If controlled substance prescribed during this visit, 12 month history viewed on the [facility] prior to issuing an initial prescription for Schedule II or III opiod.   Note:  This document was prepared using Dragon voice recognition software and may include unintentional dictation errors.     Chinita Pester, FNP 11/28/16 2010    Phineas Semen, MD 11/28/16 2056

## 2018-01-08 ENCOUNTER — Encounter: Payer: Self-pay | Admitting: Emergency Medicine

## 2018-01-08 ENCOUNTER — Emergency Department
Admission: EM | Admit: 2018-01-08 | Discharge: 2018-01-08 | Disposition: A | Discharge status: Home / Self Care | Payer: Medicaid Other | Attending: Emergency Medicine | Admitting: Emergency Medicine

## 2018-01-08 ENCOUNTER — Other Ambulatory Visit: Payer: Self-pay

## 2018-01-08 DIAGNOSIS — M62830 Muscle spasm of back: Secondary | ICD-10-CM | POA: Diagnosis not present

## 2018-01-08 DIAGNOSIS — Z79899 Other long term (current) drug therapy: Secondary | ICD-10-CM | POA: Insufficient documentation

## 2018-01-08 DIAGNOSIS — G809 Cerebral palsy, unspecified: Secondary | ICD-10-CM | POA: Diagnosis not present

## 2018-01-08 DIAGNOSIS — M545 Low back pain: Secondary | ICD-10-CM | POA: Diagnosis present

## 2018-01-08 MED ORDER — CYCLOBENZAPRINE HCL 10 MG PO TABS
5.0000 mg | ORAL_TABLET | Freq: Once | ORAL | Status: AC
  Administered 2018-01-08: 5 mg via ORAL
  Filled 2018-01-08: qty 1

## 2018-01-08 MED ORDER — MORPHINE SULFATE (PF) 4 MG/ML IV SOLN
4.0000 mg | Freq: Once | INTRAVENOUS | Status: AC
  Administered 2018-01-08: 4 mg via INTRAVENOUS
  Filled 2018-01-08: qty 1

## 2018-01-08 MED ORDER — METHYLPREDNISOLONE 4 MG PO TBPK: ORAL_TABLET | ORAL | 0 refills | Status: DC

## 2018-01-08 MED ORDER — KETOROLAC TROMETHAMINE 30 MG/ML IJ SOLN
30.0000 mg | Freq: Once | INTRAMUSCULAR | Status: AC
  Administered 2018-01-08: 30 mg via INTRAVENOUS
  Filled 2018-01-08: qty 1

## 2018-01-08 MED ORDER — BACLOFEN 10 MG PO TABS: 10.0000 mg | ORAL_TABLET | Freq: Every day | ORAL | 1 refills | Status: AC

## 2018-01-08 NOTE — ED Provider Notes (Signed)
[facility] Emergency Department Provider Note  ____________________________________________   First MD Initiated Contact with Patient 01/08/18 1326     (approximate)  I have reviewed the triage vital signs and the nursing notes.   HISTORY  Chief Complaint Back Pain    HPI Robin Hughes is a 32 y.o. female presents emergency department complaining of low back pain and spasms.  She has a history of CP.  She is not complaining of any recent falls.  Her mother states that her back locked up this morning and she was hardly able to get down the steps.  She denies any numbness or tingling.  She denies any difficulty with her bowel or bladder  Past Medical History:  Diagnosis Date  . CP (cerebral palsy) [facility](HCC)     Patient Active Problem List   Diagnosis Date Noted  . Cerebral palsy (HCC) 11/27/2015  . Restless leg syndrome 11/27/2015    History reviewed. No pertinent surgical history.  Prior to Admission medications   Medication Sig Start Date End Date Taking? Authorizing Provider  baclofen (LIORESAL) 10 MG tablet Take 1 tablet (10 mg total) by mouth daily. 01/08/18 01/08/19  Cleston Lautner, Roselyn BeringSusan W, PA-C  gabapentin (NEURONTIN) 100 MG capsule Take 100 mg by mouth at bedtime.    [provider]  methylPREDNISolone (MEDROL DOSEPAK) 4 MG TBPK tablet Take 6 pills on day one then decrease by 1 pill each day 01/08/18   Faythe GheeFisher, Arbor Leer W, PA-C  sertraline (ZOLOFT) 25 MG tablet Take 25 mg by mouth daily.    [provider]    Allergies Patient has no known allergies.  No family history on file.  Social History Social History   Tobacco Use  . Smoking status: Never Smoker  . Smokeless tobacco: Never Used  Substance Use Topics  . Alcohol use: No  . Drug use: Not on file    Review of Systems  Constitutional: No fever/chills Eyes: No visual changes. ENT: No sore throat. Respiratory: Denies cough Genitourinary: Negative for  dysuria. Musculoskeletal: Positive for back pain. Skin: Negative for rash.    ____________________________________________   PHYSICAL EXAM:  VITAL SIGNS: ED Triage Vitals  Enc Vitals Group     BP 01/08/18 1240 (!) 131/92     Pulse Rate 01/08/18 1240 72     Resp 01/08/18 1240 18     Temp 01/08/18 1240 98.3 F (36.8 C)     Temp Source 01/08/18 1240 Oral     SpO2 01/08/18 1240 99 %     Weight 01/08/18 1241 130 lb (59 kg)     Height 01/08/18 1241 4\' 11"  (1.499 m)     Head Circumference --      Peak Flow --      Pain Score 01/08/18 1241 10     Pain Loc --      Pain Edu? --      Excl. in GC? --     Constitutional: Alert and oriented. Well appearing and in no acute distress. Eyes: Conjunctivae are normal.  Head: Atraumatic. Nose: No congestion/rhinnorhea. Mouth/Throat: Mucous membranes are moist.   Cardiovascular: Normal rate, regular rhythm. Respiratory: Normal respiratory effort.  No retractions GU: deferred Musculoskeletal: Lumbar spine is tender to palpation.  The patient has difficulty going from sitting to standing.  She has difficulty getting on the stretcher.  She is neurovascularly intact Neurologic:  Normal speech and language.  Skin:  Skin is warm, dry and intact. No rash noted. Psychiatric: Mood and  affect are normal. Speech and behavior are normal.  ____________________________________________   LABS (all labs ordered are listed, but only abnormal results are displayed)  Labs Reviewed - No data to display ____________________________________________   ____________________________________________  RADIOLOGY    ____________________________________________   PROCEDURES  Procedure(s) performed: Saline lock, morphine 4 mg IV, Toradol 30 mg IV, Flexeril 5 mg p.o.  Procedures    ____________________________________________   INITIAL IMPRESSION / ASSESSMENT AND PLAN / ED COURSE  Pertinent labs & imaging results that were available during my  care of the patient were reviewed by me and considered in my medical decision making (see chart for details).  Patient is 32 year old female presenting to the emergency department complaining of low back pain with spasms since yesterday.  She has history of CP.  She denies any numbness or tingling.  On physical exam patient appears well but has difficulty going from sitting standing.  She is tender in the muscles of the lumbar spine area.  She is neurovascularly intact.  Saline lock, morphine 4 mg IV, Toradol 30 mg IV, Flexeril 5 mg p.o.   Patient states she has much relief after the medication.  She is able to sit up on her own and stand.  She moves much easier than she did upon arrival.  She states she feels like she is ready to go home.  Patient and her mother were given instructions to return to the emergency department if worsening.  She was given a prescription for Medrol Dosepak and baclofen.  She is to apply wet heat followed by ice to lower back.  She states she understands and was discharged in stable condition    As part of my medical decision making, I reviewed the following data within the electronic MEDICAL RECORD NUMBER History obtained from family, Nursing notes reviewed and incorporated, Old chart reviewed, Notes from prior ED visits and [place] Controlled Substance Database  ____________________________________________   FINAL CLINICAL IMPRESSION(S) / ED DIAGNOSES  Final diagnoses:  Muscle spasm of back      NEW MEDICATIONS STARTED DURING THIS VISIT:  Discharge Medication List as of 01/08/2018  2:23 PM    START taking these medications   Details  baclofen (LIORESAL) 10 MG tablet Take 1 tablet (10 mg total) by mouth daily., Starting Mon 01/08/2018, Until Tue 01/08/2019, Print    methylPREDNISolone (MEDROL DOSEPAK) 4 MG TBPK tablet Take 6 pills on day one then decrease by 1 pill each day, Print         Note:  This document was prepared using Dragon voice recognition  software and may include unintentional dictation errors.    Faythe Ghee, PA-C 01/08/18 1531    [name], MD 01/09/18 [phone]

## 2018-01-08 NOTE — ED Triage Notes (Signed)
Lower back spasm since yesterday. No fall or injury.

## 2018-01-08 NOTE — Discharge Instructions (Addendum)
Follow-up with your regular doctor if not better in 3 to 5 days.  Use medication as prescribed.  Return emergency department if worsening °

## 2018-05-03 ENCOUNTER — Ambulatory Visit: Payer: Medicaid Other | Admitting: Physical Therapy

## 2018-05-09 ENCOUNTER — Ambulatory Visit: Payer: Medicaid Other | Admitting: Physical Therapy

## 2018-05-10 ENCOUNTER — Ambulatory Visit: Payer: Medicaid Other | Admitting: Physical Therapy

## 2018-05-15 ENCOUNTER — Ambulatory Visit: Payer: Medicaid Other | Admitting: Physical Therapy

## 2018-05-17 ENCOUNTER — Ambulatory Visit: Payer: Medicaid Other | Admitting: Physical Therapy

## 2018-05-21 ENCOUNTER — Ambulatory Visit: Payer: Medicaid Other | Admitting: Physical Therapy

## 2018-05-22 ENCOUNTER — Ambulatory Visit: Payer: Medicaid Other | Admitting: Physical Therapy

## 2018-05-29 ENCOUNTER — Ambulatory Visit: Payer: Medicaid Other | Admitting: Physical Therapy

## 2018-06-05 ENCOUNTER — Ambulatory Visit: Payer: Medicaid Other

## 2018-06-07 ENCOUNTER — Ambulatory Visit: Payer: Medicaid Other

## 2018-06-12 ENCOUNTER — Ambulatory Visit: Payer: Medicaid Other

## 2018-06-19 ENCOUNTER — Ambulatory Visit: Payer: Medicaid Other | Admitting: Physical Therapy

## 2018-06-26 ENCOUNTER — Ambulatory Visit: Payer: Medicaid Other | Admitting: Physical Therapy

## 2018-07-03 ENCOUNTER — Ambulatory Visit: Payer: Medicaid Other | Admitting: Physical Therapy

## 2018-07-04 ENCOUNTER — Ambulatory Visit: Payer: Medicaid Other | Attending: Internal Medicine

## 2018-07-09 ENCOUNTER — Ambulatory Visit: Payer: Medicaid Other

## 2018-07-11 ENCOUNTER — Ambulatory Visit: Payer: Medicaid Other | Admitting: Physical Therapy

## 2018-07-11 ENCOUNTER — Ambulatory Visit: Payer: Medicaid Other

## 2018-07-17 ENCOUNTER — Ambulatory Visit: Payer: Medicaid Other | Admitting: Physical Therapy

## 2018-07-18 ENCOUNTER — Ambulatory Visit: Payer: Medicaid Other

## 2018-07-18 ENCOUNTER — Ambulatory Visit: Payer: Medicaid Other | Admitting: Physical Therapy

## 2018-07-24 ENCOUNTER — Ambulatory Visit: Payer: Medicaid Other | Admitting: Physical Therapy

## 2018-07-31 ENCOUNTER — Ambulatory Visit: Payer: Medicaid Other | Admitting: Physical Therapy

## 2018-08-07 ENCOUNTER — Ambulatory Visit: Payer: Medicaid Other | Admitting: Physical Therapy

## 2018-12-28 ENCOUNTER — Emergency Department
Admission: EM | Admit: 2018-12-28 | Discharge: 2018-12-28 | Disposition: A | Discharge status: Home / Self Care | Payer: Medicaid Other | Attending: Emergency Medicine | Admitting: Emergency Medicine

## 2018-12-28 ENCOUNTER — Other Ambulatory Visit: Payer: Self-pay

## 2018-12-28 ENCOUNTER — Emergency Department: Payer: Medicaid Other

## 2018-12-28 ENCOUNTER — Encounter: Payer: Self-pay | Admitting: Emergency Medicine

## 2018-12-28 DIAGNOSIS — O209 Hemorrhage in early pregnancy, unspecified: Secondary | ICD-10-CM | POA: Diagnosis present

## 2018-12-28 DIAGNOSIS — Z3A13 13 weeks gestation of pregnancy: Secondary | ICD-10-CM | POA: Insufficient documentation

## 2018-12-28 DIAGNOSIS — O200 Threatened abortion: Secondary | ICD-10-CM | POA: Insufficient documentation

## 2018-12-28 DIAGNOSIS — O4690 Antepartum hemorrhage, unspecified, unspecified trimester: Secondary | ICD-10-CM

## 2018-12-28 DIAGNOSIS — N390 Urinary tract infection, site not specified: Secondary | ICD-10-CM

## 2018-12-28 DIAGNOSIS — O2341 Unspecified infection of urinary tract in pregnancy, first trimester: Secondary | ICD-10-CM | POA: Diagnosis not present

## 2018-12-28 DIAGNOSIS — Z79899 Other long term (current) drug therapy: Secondary | ICD-10-CM | POA: Insufficient documentation

## 2018-12-28 LAB — URINALYSIS, COMPLETE (UACMP) WITH MICROSCOPIC
Bilirubin Urine: NEGATIVE
Glucose, UA: NEGATIVE mg/dL
Ketones, ur: NEGATIVE mg/dL
Nitrite: NEGATIVE
Protein, ur: NEGATIVE mg/dL
Specific Gravity, Urine: 1.009 (ref 1.005–1.030)
pH: 7 (ref 5.0–8.0)

## 2018-12-28 LAB — COMPREHENSIVE METABOLIC PANEL
ALT: 15 U/L (ref 0–44)
AST: 14 U/L — ABNORMAL LOW (ref 15–41)
Albumin: 3.4 g/dL — ABNORMAL LOW (ref 3.5–5.0)
Alkaline Phosphatase: 37 U/L — ABNORMAL LOW (ref 38–126)
Anion gap: 7 (ref 5–15)
BUN: 9 mg/dL (ref 6–20)
CO2: 22 mmol/L (ref 22–32)
Calcium: 8.8 mg/dL — ABNORMAL LOW (ref 8.9–10.3)
Chloride: 108 mmol/L (ref 98–111)
Creatinine, Ser: 0.4 mg/dL — ABNORMAL LOW (ref 0.44–1.00)
GFR calc Af Amer: >60 mL/min (ref >60)
GFR calc non Af Amer: >60 mL/min (ref >60)
Glucose, Bld: 93 mg/dL (ref 70–99)
Potassium: 3.6 mmol/L (ref 3.5–5.1)
Sodium: 137 mmol/L (ref 135–145)
Total Bilirubin: 0.4 mg/dL (ref 0.3–1.2)
Total Protein: 6.8 g/dL (ref 6.5–8.1)

## 2018-12-28 LAB — CBC WITH DIFFERENTIAL/PLATELET
Abs Immature Granulocytes: 0.07 10*3/uL (ref 0.00–0.07)
Basophils Absolute: 0.1 10*3/uL (ref 0.0–0.1)
Basophils Relative: 0 %
Eosinophils Absolute: 0.2 10*3/uL (ref 0.0–0.5)
Eosinophils Relative: 2 %
HCT: 37.1 % (ref 36.0–46.0)
Hemoglobin: 12.5 g/dL (ref 12.0–15.0)
Immature Granulocytes: 1 %
Lymphocytes Relative: 19 %
Lymphs Abs: 2.8 10*3/uL (ref 0.7–4.0)
MCH: 29.6 pg (ref 26.0–34.0)
MCHC: 33.7 g/dL (ref 30.0–36.0)
MCV: 87.9 fL (ref 80.0–100.0)
Monocytes Absolute: 0.9 10*3/uL (ref 0.1–1.0)
Monocytes Relative: 6 %
Neutro Abs: 10.8 10*3/uL — ABNORMAL HIGH (ref 1.7–7.7)
Neutrophils Relative %: 72 %
Platelets: 254 10*3/uL (ref 150–400)
RBC: 4.22 MIL/uL (ref 3.87–5.11)
RDW: 13.1 % (ref 11.5–15.5)
WBC: 14.9 10*3/uL — ABNORMAL HIGH (ref 4.0–10.5)
nRBC: 0 % (ref 0.0–0.2)

## 2018-12-28 LAB — ABO/RH: ABO/RH(D): A POS

## 2018-12-28 LAB — HCG, QUANTITATIVE, PREGNANCY: hCG, Beta Chain, Quant, S: 101521 m[IU]/mL — ABNORMAL HIGH (ref <5)

## 2018-12-28 LAB — POCT PREGNANCY, URINE: Preg Test, Ur: POSITIVE — AB

## 2018-12-28 MED ORDER — FOSFOMYCIN TROMETHAMINE 3 G PO PACK
3.0000 g | PACK | Freq: Once | ORAL | Status: AC
  Administered 2018-12-28: 3 g via ORAL
  Filled 2018-12-28: qty 3

## 2018-12-28 NOTE — ED Provider Notes (Signed)
[facility] Emergency Department Provider Note   ____________________________________________   First MD Initiated Contact with Patient 12/28/18 [phone]     (approximate)  I have reviewed the triage vital signs and the nursing notes.   HISTORY  Chief Complaint Vaginal Bleeding    HPI Robin Hughes is a 33 y.o. female with cerebral palsy G1P0 approximately 12 weeks and 5 days pregnant who presents to the ED from home with a chief complaint of vaginal bleeding.  Patient had a Pap smear performed by her OB/GYN on Monday and noted spotting which has continued since then.  Reports increased bleeding this morning with sharp lower abdominal pain.  Denies dark blood or clots.  Has not soaked through maxi pad.  Denies fever, chills, cough, nausea, vomiting, dysuria.  Denies recent travel, trauma or exposure to persons diagnosed with coronavirus.       Past Medical History:  Diagnosis Date   CP (cerebral palsy) [facility](HCC)     Patient Active Problem List   Diagnosis Date Noted   Cerebral palsy (HCC) 11/27/2015   Restless leg syndrome 11/27/2015    History reviewed. No pertinent surgical history.  Prior to Admission medications   Medication Sig Start Date End Date Taking? Authorizing Provider  baclofen (LIORESAL) 10 MG tablet Take 1 tablet (10 mg total) by mouth daily. 01/08/18 01/08/19  Fisher, Roselyn BeringSusan W, PA-C  gabapentin (NEURONTIN) 100 MG capsule Take 100 mg by mouth at bedtime.    [provider]  methylPREDNISolone (MEDROL DOSEPAK) 4 MG TBPK tablet Take 6 pills on day one then decrease by 1 pill each day 01/08/18   Faythe GheeFisher, Susan W, PA-C  sertraline (ZOLOFT) 25 MG tablet Take 25 mg by mouth daily.    [provider]    Allergies Patient has no known allergies.  No family history on file.  Social History Social History   Tobacco Use   Smoking status: Never Smoker   Smokeless tobacco: Never Used  Substance Use Topics   Alcohol  use: No   Drug use: Never    Review of Systems  Constitutional: No fever/chills Eyes: No visual changes. ENT: No sore throat. Cardiovascular: Denies chest pain. Respiratory: Denies shortness of breath. Gastrointestinal: No abdominal pain.  No nausea, no vomiting.  No diarrhea.  No constipation. Genitourinary: Positive for vaginal bleeding.  Negative for dysuria. Musculoskeletal: Negative for back pain. Skin: Negative for rash. Neurological: Negative for headaches, focal weakness or numbness.   ____________________________________________   PHYSICAL EXAM:  VITAL SIGNS: ED Triage Vitals  Enc Vitals Group     BP 12/28/18 0514 (!) 141/93     Pulse Rate 12/28/18 0514 (!) 105     Resp 12/28/18 0514 16     Temp 12/28/18 0514 97.7 F (36.5 C)     Temp Source 12/28/18 0514 Oral     SpO2 12/28/18 0514 100 %     Weight 12/28/18 0534 128 lb (58.1 kg)     Height 12/28/18 0534 4\' 11"  (1.499 m)     Head Circumference --      Peak Flow --      Pain Score 12/28/18 0534 6     Pain Loc --      Pain Edu? --      Excl. in GC? --     Constitutional: Alert and oriented. Well appearing and in no acute distress. Eyes: Conjunctivae are normal. PERRL. EOMI. Head: Atraumatic. Nose: No congestion/rhinnorhea. Mouth/Throat: Mucous membranes are moist.  Oropharynx non-erythematous.  Neck: No stridor.   Cardiovascular: Normal rate, regular rhythm. Grossly normal heart sounds.  Good peripheral circulation. Respiratory: Normal respiratory effort.  No retractions. Lungs CTAB. Gastrointestinal: Soft and nontender to light or deep palpation. No distention. No abdominal bruits. No CVA tenderness. Musculoskeletal: No lower extremity tenderness nor edema.  No joint effusions. Neurologic:  Normal speech and language. No gross focal neurologic deficits are appreciated. No gait instability. Skin:  Skin is warm, dry and intact. No rash noted. Psychiatric: Mood and affect are highly anxious. Speech and  behavior are normal.  ____________________________________________   LABS (all labs ordered are listed, but only abnormal results are displayed)  Labs Reviewed  CBC WITH DIFFERENTIAL/PLATELET - Abnormal; Notable for the following components:      Result Value   WBC 14.9 (*)    Neutro Abs 10.8 (*)    All other components within normal limits  COMPREHENSIVE METABOLIC PANEL - Abnormal; Notable for the following components:   Creatinine, Ser 0.40 (*)    Calcium 8.8 (*)    Albumin 3.4 (*)    AST 14 (*)    Alkaline Phosphatase 37 (*)    All other components within normal limits  HCG, QUANTITATIVE, PREGNANCY - Abnormal; Notable for the following components:   hCG, Beta Chain, Quant, S 101,521 (*)    All other components within normal limits  URINALYSIS, COMPLETE (UACMP) WITH MICROSCOPIC - Abnormal; Notable for the following components:   Color, Urine STRAW (*)    APPearance CLEAR (*)    Hgb urine dipstick MODERATE (*)    Leukocytes,Ua SMALL (*)    Bacteria, UA FEW (*)    All other components within normal limits  POCT PREGNANCY, URINE - Abnormal; Notable for the following components:   Preg Test, Ur POSITIVE (*)    All other components within normal limits  URINE CULTURE  POC URINE PREG, ED  ABO/RH   ____________________________________________  EKG  None ____________________________________________  RADIOLOGY  ED MD interpretation: Pending  Official radiology report(s): No results found.  ____________________________________________   PROCEDURES  Procedure(s) performed (including Critical Care):  Procedures   ____________________________________________   INITIAL IMPRESSION / ASSESSMENT AND PLAN / ED COURSE  As part of my medical decision making, I reviewed the following data within the [place] notes reviewed and incorporated, Labs reviewed and Notes from prior ED visits     Robin Hughes was evaluated in Emergency  Department on 12/28/2018 for the symptoms described in the history of present illness. She was evaluated in the context of the global COVID-19 pandemic, which necessitated consideration that the patient might be at risk for infection with the SARS-CoV-2 virus that causes COVID-19. Institutional protocols and algorithms that pertain to the evaluation of patients at risk for COVID-19 are in a state of rapid change based on information released by regulatory bodies including the CDC and federal and state organizations. These policies and algorithms were followed during the patient's care in the ED.   33 year old G1, P0 approximately 12 weeks and 5 days pregnant who presents with vaginal bleeding. Differential diagnosis includes, but is not limited to, ovarian cyst, ovarian torsion, acute appendicitis, diverticulitis, urinary tract infection/pyelonephritis, endometriosis, bowel obstruction, colitis, renal colic, gastroenteritis, hernia, fibroids, endometriosis, pregnancy related pain including ectopic pregnancy, etc.  Will check lab work, blood type, and proceed with pelvic ultrasound.   Clinical Course as of Dec 28 702  Fri Dec 28, 2018  0703 Patient still in ultrasound.  Care transferred to Dr.  Goodman at change of shift.  Disposition pending ultrasound results.   [JS]    Clinical Course User Index [JS] Irean HongSung, Mayrin Schmuck J, MD     ____________________________________________   FINAL CLINICAL IMPRESSION(S) / ED DIAGNOSES  Final diagnoses:  Vaginal bleeding in pregnancy  Threatened miscarriage  Urinary tract infection without hematuria, site unspecified     ED Discharge Orders    None       Note:  This document was prepared using Dragon voice recognition software and may include unintentional dictation errors.   Irean HongSung, Mikki Ziff J, MD 12/28/18 [phone]

## 2018-12-28 NOTE — Discharge Instructions (Addendum)
Please seek medical attention for any high fevers, chest pain, shortness of breath, change in behavior, persistent vomiting, bloody stool or any other new or concerning symptoms.  

## 2018-12-28 NOTE — ED Notes (Signed)
Patient transported to Ultrasound 

## 2018-12-28 NOTE — ED Triage Notes (Signed)
Pt arrived to ED with c/o worsening vaginal bleeding during pregnancy. Pt states she is currently 12 weeks and 5 days pregnant. Papsmear performed on Monday by her MD and pt noticed spotting which has continued since then. Pt reports bleeding increased this morning with sharp lower abd pain. Pt reports blood is bright red in color.

## 2018-12-28 NOTE — ED Provider Notes (Signed)
US shows live IUP at 13 weeks 1 day. Discussed finding with patient. Discussed importance of follow up with ob/gyn.   Nance Pear, MD 12/28/18 [phone]

## 2018-12-30 LAB — URINE CULTURE: Culture: NO GROWTH

## 2019-05-14 ENCOUNTER — Other Ambulatory Visit: Payer: Self-pay

## 2019-05-14 ENCOUNTER — Observation Stay
Admission: EM | Admit: 2019-05-14 | Discharge: 2019-05-14 | Disposition: A | Discharge status: Home / Self Care | Payer: Medicaid Other | Attending: Obstetrics and Gynecology | Admitting: Obstetrics and Gynecology

## 2019-05-14 DIAGNOSIS — Z3403 Encounter for supervision of normal first pregnancy, third trimester: Secondary | ICD-10-CM | POA: Diagnosis not present

## 2019-05-14 DIAGNOSIS — Z3A32 32 weeks gestation of pregnancy: Secondary | ICD-10-CM | POA: Diagnosis not present

## 2019-05-14 DIAGNOSIS — O99353 Diseases of the nervous system complicating pregnancy, third trimester: Secondary | ICD-10-CM | POA: Diagnosis not present

## 2019-05-14 DIAGNOSIS — Z79899 Other long term (current) drug therapy: Secondary | ICD-10-CM | POA: Insufficient documentation

## 2019-05-14 LAB — URINALYSIS, ROUTINE W REFLEX MICROSCOPIC
Bilirubin Urine: NEGATIVE
Glucose, UA: NEGATIVE mg/dL
Hgb urine dipstick: NEGATIVE
Ketones, ur: 5 mg/dL — AB
Nitrite: NEGATIVE
Protein, ur: 30 mg/dL — AB
Specific Gravity, Urine: 1.013 (ref 1.005–1.030)
pH: 7 (ref 5.0–8.0)

## 2019-05-14 MED ORDER — TERBUTALINE SULFATE 1 MG/ML IJ SOLN
0.2500 mg | Freq: Once | INTRAMUSCULAR | Status: AC
  Administered 2019-05-14: 13:00:00 0.25 mg via SUBCUTANEOUS

## 2019-05-14 MED ORDER — TERBUTALINE SULFATE 1 MG/ML IJ SOLN
INTRAMUSCULAR | Status: AC
  Administered 2019-05-14: 13:00:00 0.25 mg via SUBCUTANEOUS
  Filled 2019-05-14: qty 1

## 2019-05-14 NOTE — Progress Notes (Signed)
Pt d/c home per T.Schermerhorn, MD order. Pt received AVS and discharge instructions as well as labor precautions, healthy eating during pregnancy,braxton hicks, and constipation during pregnancy. Pt educated on when to return to the ED or obgyn office. Pt and her mother verbalized understanding. Pt d/c home via wheelchair and into personal vehicle.

## 2019-05-14 NOTE — Discharge Summary (Signed)
  Constitutional: NAD, AAOx3  HE/ENT: extraocular movements grossly intact, moist mucous membranes CV: RRR PULM: nl respiratory effort, CTABL                                         Abd: gravid, non-tender, non-distended, soft                                                  Ext: Non-tender, Nonedmeatous                     Psych: mood appropriate, speech normal Pelvic closed / 50% OOP  NST: Reactive Baseline:150  Variability: moderate Accelerations present x >2 Decelerations absent  UTX irritability  Time 20mins    Assessment: 33 y.o. [redacted]w[redacted]d here for antenatal surveillance during pregnancy.  Principle diagnosis: pelvci cramping   uterine irritability    Plan:  Labor: not present.   Fetal Wellbeing: Reassuring Cat 1 tracing.  Reactive NST   D/c home stable, precautions reviewed, follow-up as scheduled.  Prt is instructed to call her providers in am  ----- T Makaiyah Schweiger MD Attending Obstetrician and Gynecologist [facility], Department of OB/GYN [facility]         Electronically signed by Alonnah Lampkins J, MD at 11/17/2  

## 2019-05-14 NOTE — OB Triage Note (Signed)
Pt presents to the ED c/o abdominal pain and back pain that started during the night. Pt had a few small blood clots this morning after urinating. Pt is a G1P0 [redacted]w[redacted]d. Pt denies continous leaking of fluid or current vaginal bleeding. States positive fetal movement. Rating back and abdominal pain as 8/10. External monitors applied and assessing. Initial FHR 145. VSS

## 2019-05-14 NOTE — Progress Notes (Signed)
Patient ID: Robin Hughes, female   DOB: February 24, 1986, 33 y.o.   MRN: [id number]  Robin Hughes is a 33 y.o. female. She is at [redacted]w[redacted]d gestation. No LMP recorded. Patient is pregnant. Estimated Date of Delivery: 07/07/19  [facility]atal care [facility]: [facility]  Chief complaint: Pelvic cramping at 32+2 weeks . Small spotting today . No recent sexual activity  Location: Onset/timing: Duration: Quality:  Severity: Aggravating or alleviating conditions: Associated signs/symptoms: Context:  S: Resting comfortably. no CTX, no VB.no LOF,  Active fetal movement.   Maternal Medical History:   Past Medical History:  Diagnosis Date  . CP (cerebral palsy) ([place])     History reviewed. No pertinent surgical history.  No Known Allergies  Prior to Admission medications   Medication Sig Start Date End Date Taking? Authorizing Provider  gabapentin (NEURONTIN) 100 MG capsule Take 100 mg by mouth at bedtime.    [provider]  methylPREDNISolone (MEDROL DOSEPAK) 4 MG TBPK tablet Take 6 pills on day one then decrease by 1 pill each day Patient not taking: Reported on 05/14/2019 01/08/18   Versie Starks, PA-C  sertraline (ZOLOFT) 25 MG tablet Take 25 mg by mouth daily.    [provider]     Social History: She  reports that she has never smoked. She has never used smokeless tobacco. She reports that she does not drink alcohol or use drugs.  Family History: family history is not on file.  no history of gyn cancers  Review of Systems: A full review of systems was performed and negative except as noted in the HPI.   Review of Systems: A full review of systems was performed and negative except as noted in the HPI.   Eyes: no vision change  Ears: left ear pain  Oropharynx: no sore throat  Pulmonary . No shortness of breath , no hemoptysis Cardiovascular: no chest pain , no irregular heart beat  Gastrointestinal:no blood in stool . No diarrhea, no constipation Uro gynecologic: no  dysuria , no pelvic pain Neurologic : no seizure , no migraines    Musculoskeletal: no muscular weakness   O:  BP (!) 125/91   Pulse 99   Ht 4\' 11"  (1.499 m)   Wt 69.9 kg   BMI 31.10 kg/m  Results for orders placed or performed during the hospital encounter of 05/14/19 (from the past 48 hour(s))  Urinalysis, Routine w reflex microscopic   Collection Time: 05/14/19  2:15 PM  Result Value Ref Range   Color, Urine YELLOW (A) YELLOW   APPearance HAZY (A) CLEAR   Specific Gravity, Urine 1.013 1.005 - 1.030   pH 7.0 5.0 - 8.0   Glucose, UA NEGATIVE NEGATIVE mg/dL   Hgb urine dipstick NEGATIVE NEGATIVE   Bilirubin Urine NEGATIVE NEGATIVE   Ketones, ur 5 (A) NEGATIVE mg/dL   Protein, ur 30 (A) NEGATIVE mg/dL   Nitrite NEGATIVE NEGATIVE   Leukocytes,Ua TRACE (A) NEGATIVE   RBC / HPF 21-50 0 - 5 RBC/hpf   WBC, UA 0-5 0 - 5 WBC/hpf   Bacteria, UA RARE (A) NONE SEEN   Squamous Epithelial / LPF 0-5 0 - 5   Mucus PRESENT      Constitutional: NAD, AAOx3  HE/ENT: extraocular movements grossly intact, moist mucous membranes CV: RRR PULM: nl respiratory effort, CTABL     Abd: gravid, non-tender, non-distended, soft      Ext: Non-tender, Nonedmeatous   Psych: mood appropriate, speech normal Pelvic closed / 50% OOP  NST: Reactive Baseline:150  Variability: moderate Accelerations present x >2 Decelerations absent  UTX irritability  Time    Assessment: 33 y.o. [redacted]w[redacted]d here for antenatal surveillance during pregnancy.  Principle diagnosis: pelvci cramping   uterine irritability    Plan:  Labor: not present.   Fetal Wellbeing: Reassuring Cat 1 tracing.  Reactive NST   D/c home stable, precautions reviewed, follow-up as scheduled.  Prt is instructed to call her providers in am  ----- Robin Gust MD Attending Obstetrician and Gynecologist [facility], Department of OB/GYN [facility]

## 2019-05-14 NOTE — Discharge Summary (Signed)
  Constitutional: NAD, AAOx3  HE/ENT: extraocular movements grossly intact, moist mucous membranes CV: RRR PULM: nl respiratory effort, CTABL                                         Abd: gravid, non-tender, non-distended, soft                                                  Ext: Non-tender, Nonedmeatous                     Psych: mood appropriate, speech normal Pelvic closed / 50% OOP  NST: Reactive Baseline:150  Variability: moderate Accelerations present x >2 Decelerations absent  UTX irritability  Time 60mins    Assessment: 33 y.o. [redacted]w[redacted]d here for antenatal surveillance during pregnancy.  Principle diagnosis: pelvci cramping   uterine irritability    Plan:  Labor: not present.   Fetal Wellbeing: Reassuring Cat 1 tracing.  Reactive NST   D/c home stable, precautions reviewed, follow-up as scheduled.  Prt is instructed to call her providers in am  ----- Huel Cote MD Attending Obstetrician and Gynecologist [facility], Department of [facility]         Electronically signed by Artha Chiasson, Gwen Her, MD at 11/17/2

## 2019-10-28 ENCOUNTER — Encounter: Payer: Self-pay | Admitting: Emergency Medicine

## 2019-10-28 ENCOUNTER — Emergency Department: Payer: Medicaid Other

## 2019-10-28 ENCOUNTER — Emergency Department
Admission: EM | Admit: 2019-10-28 | Discharge: 2019-10-28 | Disposition: A | Discharge status: Home / Self Care | Payer: Medicaid Other | Attending: Emergency Medicine | Admitting: Emergency Medicine

## 2019-10-28 ENCOUNTER — Other Ambulatory Visit: Payer: Self-pay

## 2019-10-28 DIAGNOSIS — Z79899 Other long term (current) drug therapy: Secondary | ICD-10-CM | POA: Insufficient documentation

## 2019-10-28 DIAGNOSIS — G809 Cerebral palsy, unspecified: Secondary | ICD-10-CM | POA: Insufficient documentation

## 2019-10-28 DIAGNOSIS — L03115 Cellulitis of right lower limb: Secondary | ICD-10-CM | POA: Diagnosis not present

## 2019-10-28 DIAGNOSIS — M25561 Pain in right knee: Secondary | ICD-10-CM | POA: Diagnosis present

## 2019-10-28 MED ORDER — TRAMADOL HCL 50 MG PO TABS
50.0000 mg | ORAL_TABLET | Freq: Once | ORAL | Status: AC
  Administered 2019-10-28: 14:00:00 50 mg via ORAL
  Filled 2019-10-28: qty 1

## 2019-10-28 MED ORDER — OXYCODONE-ACETAMINOPHEN 7.5-325 MG PO TABS: 1.0000 | ORAL_TABLET | Freq: Four times a day (QID) | ORAL | 0 refills | Status: DC | PRN

## 2019-10-28 MED ORDER — SULFAMETHOXAZOLE-TRIMETHOPRIM 800-160 MG PO TABS: 1.0000 | ORAL_TABLET | Freq: Two times a day (BID) | ORAL | 0 refills | Status: DC

## 2019-10-28 MED ORDER — CLINDAMYCIN PHOSPHATE 600 MG/50ML IV SOLN
600.0000 mg | Freq: Once | INTRAVENOUS | Status: AC
  Administered 2019-10-28: 16:00:00 600 mg via INTRAVENOUS
  Filled 2019-10-28: qty 50

## 2019-10-28 MED ORDER — KETOROLAC TROMETHAMINE 30 MG/ML IJ SOLN
30.0000 mg | Freq: Once | INTRAMUSCULAR | Status: AC
  Administered 2019-10-28: 30 mg via INTRAVENOUS
  Filled 2019-10-28: qty 1

## 2019-10-28 NOTE — ED Provider Notes (Signed)
[facility] Emergency Department Provider Note   ____________________________________________   First MD Initiated Contact with Patient 10/28/19 1343     (approximate)  I have reviewed the triage vital signs and the nursing notes.   HISTORY  Chief Complaint Knee Pain    HPI Robin Hughes is a 34 y.o. female patient complain right knee pain for few days.  Patient denies provocative incident.  Patient states there has been swelling to the anterior patella for few weeks.  Patient states she is scheduled to see orthopedics on 11/08/2019 but cannot wait secondary to increased pain and redness.  Patient has cerebral palsy affecting the right side.  Patient rates the pain as a 7/10.  Patient describes the pain as "aching".  No palliative measure for complaint.         Past Medical History:  Diagnosis Date  . CP (cerebral palsy) [facility])     Patient Active Problem List   Diagnosis Date Noted  . Indication for care in labor and delivery, antepartum 05/14/2019  . Cerebral palsy ([place]) 11/27/2015  . Restless leg syndrome 11/27/2015    History reviewed. No pertinent surgical history.  Prior to Admission medications   Medication Sig Start Date End Date Taking? Authorizing Provider  gabapentin (NEURONTIN) 100 MG capsule Take 100 mg by mouth at bedtime.    [provider]  oxyCODONE-acetaminophen (PERCOCET) 7.5-325 MG tablet Take 1 tablet by mouth every 6 (six) hours as needed. 10/28/19   Sable Feil, PA-C  sertraline (ZOLOFT) 25 MG tablet Take 25 mg by mouth daily.    [provider]  sulfamethoxazole-trimethoprim (BACTRIM DS) 800-160 MG tablet Take 1 tablet by mouth 2 (two) times daily. 10/28/19   Sable Feil, PA-C    Allergies Patient has no known allergies.  No family history on file.  Social History Social History   Tobacco Use  . Smoking status: Never Smoker  . Smokeless tobacco: Never Used  Substance Use Topics  .  Alcohol use: No  . Drug use: Never    Review of Systems Constitutional: No fever/chills Eyes: No visual changes. ENT: No sore throat. Cardiovascular: Denies chest pain. Respiratory: Denies shortness of breath. Gastrointestinal: No abdominal pain.  No nausea, no vomiting.  No diarrhea.  No constipation. Genitourinary: Negative for dysuria. Musculoskeletal: Negative for back pain. Skin: Negative for rash.  Edema and erythema to anterior right knee. Neurological: Negative for headaches, focal weakness or numbness.   ____________________________________________   PHYSICAL EXAM:  VITAL SIGNS: ED Triage Vitals  Enc Vitals Group     BP 10/28/19 1307 121/74     Pulse Rate 10/28/19 1307 83     Resp 10/28/19 1307 16     Temp 10/28/19 1307 98.6 F (37 C)     Temp Source 10/28/19 1307 Oral     SpO2 10/28/19 1307 100 %     Weight 10/28/19 1308 130 lb (59 kg)     Height 10/28/19 1308 4\' 11"  (1.499 m)     Head Circumference --      Peak Flow --      Pain Score 10/28/19 1307 7     Pain Loc --      Pain Edu? --      Excl. in [place]? --    Constitutional: Alert and oriented. Well appearing and in no acute distress.  cardiovascular: Normal rate, regular rhythm. Grossly normal heart sounds.  Good peripheral circulation. Respiratory: Normal respiratory effort.  No retractions. Lungs  CTAB. Musculoskeletal: No obvious deformity to the right knee.  Moderate guarding palpation.  Moderate edema and erythema.  Neurologic:  Normal speech and language. No gross focal neurologic deficits are appreciated. No gait instability. Skin:  Skin is warm, dry and intact. No rash noted.  Edema and erythema anterior right patella. Psychiatric: Mood and affect are normal. Speech and behavior are normal.  ____________________________________________   LABS (all labs ordered are listed, but only abnormal results are displayed)  Labs Reviewed - No data to  display ____________________________________________  EKG   ____________________________________________  RADIOLOGY  ED MD interpretation:    Official radiology report(s): [place] Extrem Low Right Ltd  Result Date: 10/28/2019 CLINICAL DATA:  Edema and erythema anterior to the right patella. EXAM: ULTRASOUND RIGHT LOWER EXTREMITY LIMITED TECHNIQUE: Ultrasound examination of the lower extremity soft tissues was performed in the area of clinical concern. COMPARISON:  None. FINDINGS: There is poorly defined edema in the subcutaneous fat anterior to the patella. The appearance is not typical for prepatellar bursitis. No defined fluid collection. IMPRESSION: Findings consistent cellulitis anterior to the patella. No definable abscess. The appearance is not consistent with prepatellar bursitis. Electronically Signed   By: Francene Boyers M.D.   On: 10/28/2019 14:48    ____________________________________________   PROCEDURES  Procedure(s) performed (including Critical Care):  Procedures   ____________________________________________   INITIAL IMPRESSION / ASSESSMENT AND PLAN / ED COURSE  As part of my medical decision making, I reviewed the following data within the electronic MEDICAL RECORD NUMBER     Patient presents with pain and edema to the anterior patella.  Physical exam and ultrasound findings consistent with cellulitis.  Patient given IV antibiotics and will be discharged on oral medication.  Advised to follow-up PCP per schedule appointment on 11/08/2019.  Return to ED if no improvement or worsening of complaint. Robin Hughes was evaluated in Emergency Department on 10/28/2019 for the symptoms described in the history of present illness. She was evaluated in the context of the global COVID-19 pandemic, which necessitated consideration that the patient might be at risk for infection with the SARS-CoV-2 virus that causes COVID-19. Institutional protocols and algorithms that pertain to the  evaluation of patients at risk for COVID-19 are in a state of rapid change based on information released by regulatory bodies including the CDC and federal and state organizations. These policies and algorithms were followed during the patient's care in the ED.       ____________________________________________   FINAL CLINICAL IMPRESSION(S) / ED DIAGNOSES  Final diagnoses:  Cellulitis of right knee     ED Discharge Orders         Ordered    sulfamethoxazole-trimethoprim (BACTRIM DS) 800-160 MG tablet  2 times daily     10/28/19 1502    oxyCODONE-acetaminophen (PERCOCET) 7.5-325 MG tablet  Every 6 hours PRN     10/28/19 1502           Note:  This document was prepared using Dragon voice recognition software and may include unintentional dictation errors.    Joni Reining, PA-C 10/28/19 1512    Concha Se, MD 10/29/19 [phone]

## 2019-10-28 NOTE — Discharge Instructions (Signed)
Follow discharge care instruction take medication as directed.  Follow-up with schedule appointment.

## 2019-10-28 NOTE — ED Notes (Signed)
See triage note, pt states "I think I have infection in my right knee", started worsening a week ago.  Redness, swelling and sore noted to right knee.  Denies fevers. Reports headaches, Nausea.

## 2019-10-28 NOTE — ED Triage Notes (Signed)
Pt to ED via POV c/o right knew pain. Pt has some swelling in her knee cap. Pt states that she has appt on 5/14 but she was unable to wait due to pain. Pt is in NAD.

## 2020-03-19 ENCOUNTER — Emergency Department
Admission: EM | Admit: 2020-03-19 | Discharge: 2020-03-19 | Disposition: A | Discharge status: Home / Self Care | Payer: Medicaid Other | Attending: Emergency Medicine | Admitting: Emergency Medicine

## 2020-03-19 ENCOUNTER — Other Ambulatory Visit: Payer: Self-pay

## 2020-03-19 ENCOUNTER — Emergency Department: Payer: Medicaid Other

## 2020-03-19 DIAGNOSIS — J1282 Pneumonia due to coronavirus disease 2019: Secondary | ICD-10-CM | POA: Diagnosis not present

## 2020-03-19 DIAGNOSIS — R000 Tachycardia, unspecified: Secondary | ICD-10-CM | POA: Diagnosis not present

## 2020-03-19 DIAGNOSIS — U071 COVID-19: Secondary | ICD-10-CM | POA: Diagnosis present

## 2020-03-19 LAB — COMPREHENSIVE METABOLIC PANEL
ALT: 29 U/L (ref 0–44)
AST: 25 U/L (ref 15–41)
Albumin: 4.2 g/dL (ref 3.5–5.0)
Alkaline Phosphatase: 73 U/L (ref 38–126)
Anion gap: 10 (ref 5–15)
BUN: 6 mg/dL (ref 6–20)
CO2: 23 mmol/L (ref 22–32)
Calcium: 9 mg/dL (ref 8.9–10.3)
Chloride: 106 mmol/L (ref 98–111)
Creatinine, Ser: 0.63 mg/dL (ref 0.44–1.00)
GFR calc Af Amer: >60 mL/min (ref >60)
GFR calc non Af Amer: >60 mL/min (ref >60)
Glucose, Bld: 97 mg/dL (ref 70–99)
Potassium: 3.6 mmol/L (ref 3.5–5.1)
Sodium: 139 mmol/L (ref 135–145)
Total Bilirubin: 0.5 mg/dL (ref 0.3–1.2)
Total Protein: 8.2 g/dL — ABNORMAL HIGH (ref 6.5–8.1)

## 2020-03-19 LAB — RESPIRATORY PANEL BY RT PCR (FLU A&B, COVID)
Influenza A by PCR: NEGATIVE
Influenza B by PCR: NEGATIVE
SARS Coronavirus 2 by RT PCR: POSITIVE — AB

## 2020-03-19 LAB — CBC WITH DIFFERENTIAL/PLATELET
Abs Immature Granulocytes: 0.01 10*3/uL (ref 0.00–0.07)
Basophils Absolute: 0 10*3/uL (ref 0.0–0.1)
Basophils Relative: 1 %
Eosinophils Absolute: 0 10*3/uL (ref 0.0–0.5)
Eosinophils Relative: 0 %
HCT: 39.2 % (ref 36.0–46.0)
Hemoglobin: 12.6 g/dL (ref 12.0–15.0)
Immature Granulocytes: 0 %
Lymphocytes Relative: 17 %
Lymphs Abs: 1 10*3/uL (ref 0.7–4.0)
MCH: 25.4 pg — ABNORMAL LOW (ref 26.0–34.0)
MCHC: 32.1 g/dL (ref 30.0–36.0)
MCV: 78.9 fL — ABNORMAL LOW (ref 80.0–100.0)
Monocytes Absolute: 1.2 10*3/uL — ABNORMAL HIGH (ref 0.1–1.0)
Monocytes Relative: 20 %
Neutro Abs: 3.7 10*3/uL (ref 1.7–7.7)
Neutrophils Relative %: 62 %
Platelets: 255 10*3/uL (ref 150–400)
RBC: 4.97 MIL/uL (ref 3.87–5.11)
RDW: 15.9 % — ABNORMAL HIGH (ref 11.5–15.5)
WBC: 6 10*3/uL (ref 4.0–10.5)
nRBC: 0 % (ref 0.0–0.2)

## 2020-03-19 LAB — FIBRIN DERIVATIVES D-DIMER (ARMC ONLY): Fibrin derivatives D-dimer (ARMC): 342.33 ng/mL (FEU) (ref 0.00–499.00)

## 2020-03-19 LAB — TROPONIN I (HIGH SENSITIVITY): Troponin I (High Sensitivity): 3 ng/L (ref <18)

## 2020-03-19 NOTE — ED Triage Notes (Signed)
Pt here with covid symptoms after family tested positive. Symptoms started on Tues with a sore throat and fatigue. Pt NAD in triage.

## 2020-03-19 NOTE — ED Provider Notes (Signed)
[facility] Emergency Department Provider Note  ____________________________________________   First MD Initiated Contact with Patient 03/19/20 1937     (approximate)  I have reviewed the triage vital signs and the nursing notes.   HISTORY  Chief Complaint Covid Exposure   HPI Robin Hughes is a 34 y.o. female with a past medical history of cerebral palsy as well as recent COVID-19 exposure from family member who presents for assessment approximately 2 days of fevers, chills, cough, chest pain, myalgias, fatigue, and poor p.o. appetite.  Patient denies any earache, sore throat, vision changes, rash, extremity pain, vomiting, diarrhea, dysuria, or other acute complaints.  No clear alleviating aggravating factors.  No prior similar episodes.  Patient denies EtOH or illicit drug use or tobacco abuse.         Past Medical History:  Diagnosis Date  . CP (cerebral palsy) [facility])     Patient Active Problem List   Diagnosis Date Noted  . Indication for care in labor and delivery, antepartum 05/14/2019  . Cerebral palsy ([facility]) 11/27/2015  . Restless leg syndrome 11/27/2015    No past surgical history on file.  Prior to Admission medications   Medication Sig Start Date End Date Taking? Authorizing Provider  gabapentin (NEURONTIN) 100 MG capsule Take 100 mg by mouth at bedtime.    [provider]  oxyCODONE-acetaminophen (PERCOCET) 7.5-325 MG tablet Take 1 tablet by mouth every 6 (six) hours as needed. 10/28/19   Joni Reining, PA-C  sertraline (ZOLOFT) 25 MG tablet Take 25 mg by mouth daily.    [provider]  sulfamethoxazole-trimethoprim (BACTRIM DS) 800-160 MG tablet Take 1 tablet by mouth 2 (two) times daily. 10/28/19   Joni Reining, PA-C    Allergies Patient has no known allergies.  No family history on file.  Social History Social History   Tobacco Use  . Smoking status: Never Smoker  . Smokeless tobacco: Never Used    Vaping Use  . Vaping Use: Never used  Substance Use Topics  . Alcohol use: No  . Drug use: Never    Review of Systems  Review of Systems  Constitutional: Positive for chills and fever.  HENT: Positive for sore throat.   Eyes: Negative for pain.  Respiratory: Positive for cough and sputum production. Negative for stridor.   Cardiovascular: Positive for chest pain.  Gastrointestinal: Negative for vomiting.  Genitourinary: Negative for dysuria.  Musculoskeletal: Positive for myalgias.  Skin: Negative for rash.  Neurological: Negative for seizures, loss of consciousness and headaches.  Psychiatric/Behavioral: Negative for suicidal ideas.  All other systems reviewed and are negative.     ____________________________________________   PHYSICAL EXAM:  VITAL SIGNS: ED Triage Vitals  Enc Vitals Group     BP 03/19/20 1846 137/84     Pulse Rate 03/19/20 1846 (!) 109     Resp 03/19/20 1846 18     Temp 03/19/20 1846 99.2 F (37.3 C)     Temp Source 03/19/20 1846 Oral     SpO2 03/19/20 1846 100 %     Weight 03/19/20 1847 130 lb (59 kg)     Height 03/19/20 1847 4\' 11"  (1.499 m)     Head Circumference --      Peak Flow --      Pain Score 03/19/20 1847 7     Pain Loc --      Pain Edu? --      Excl. in [place]? --    Vitals:  03/19/20 1846  BP: 137/84  Pulse: (!) 109  Resp: 18  Temp: 99.2 F (37.3 C)  SpO2: 100%   Physical Exam Vitals and nursing note reviewed.  Constitutional:      General: She is not in acute distress.    Appearance: She is well-developed.  HENT:     Head: Normocephalic and atraumatic.     Right Ear: External ear normal.     Left Ear: External ear normal.     Nose: Nose normal.  Eyes:     Conjunctiva/sclera: Conjunctivae normal.  Cardiovascular:     Rate and Rhythm: Regular rhythm. Tachycardia present.     Heart sounds: No murmur heard.   Pulmonary:     Effort: Pulmonary effort is normal. No respiratory distress.     Breath sounds: Normal  breath sounds.  Abdominal:     Palpations: Abdomen is soft.     Tenderness: There is no abdominal tenderness.  Musculoskeletal:     Cervical back: Neck supple.  Skin:    General: Skin is warm and dry.     Capillary Refill: Capillary refill takes less than 2 seconds.  Neurological:     Mental Status: She is alert and oriented to person, place, and time.  Psychiatric:        Mood and Affect: Mood normal.     Patient has full range of motion of her neck.  PERRLA.  EOMI.  No significant tenderness over the anterior submandibular lymph nodes. ____________________________________________   LABS (all labs ordered are listed, but only abnormal results are displayed)  Labs Reviewed  RESPIRATORY PANEL BY RT PCR (FLU A&B, COVID) - Abnormal; Notable for the following components:      Result Value   SARS Coronavirus 2 by RT PCR POSITIVE (*)    All other components within normal limits  CBC WITH DIFFERENTIAL/PLATELET - Abnormal; Notable for the following components:   MCV 78.9 (*)    MCH 25.4 (*)    RDW 15.9 (*)    Monocytes Absolute 1.2 (*)    All other components within normal limits  COMPREHENSIVE METABOLIC PANEL - Abnormal; Notable for the following components:   Total Protein 8.2 (*)    All other components within normal limits  FIBRIN DERIVATIVES D-DIMER ([facility] ONLY)  TROPONIN I (HIGH SENSITIVITY)   ____________________________________________  EKG  Sinus rhythm with a ventricular rate of 98, incomplete right bundle branch block, normal axis, unremarkable levels, and no clear evidence of acute ischemia or other significant underlying arrhythmia. ____________________________________________  RADIOLOGY  ED MD interpretation: Streaky opacities bilaterally concerning for for likely Covid pneumonia.  No effusion, edema, pneumothorax, or other acute thoracic process.  Official radiology report(s): DG Chest 1 View  Result Date: 03/19/2020 CLINICAL DATA:  Cough EXAM: CHEST  1 VIEW  COMPARISON:  08/16/2007 FINDINGS: There is a streaky right infrahilar airspace opacity. Otherwise the lungs are clear. There is no pneumothorax. No large pleural effusion. The heart size is unremarkable. There is no acute osseous abnormality. IMPRESSION: Streaky right infrahilar airspace opacity could represent atelectasis or infiltrate. Electronically Signed   By: Katherine Mantle M.D.   On: 03/19/2020 20:37    ____________________________________________   PROCEDURES  Procedure(s) performed (including Critical Care):  Procedures   ____________________________________________   INITIAL IMPRESSION / ASSESSMENT AND PLAN / ED COURSE        Patient presents with above to history exam for assessment of congestion, cough, chest pain, myalgias, fevers, chills, and malaise.  Patient is tachycardic at  109 otherwise stable vital signs on room air.  Exam as above.  Overall patient history, exam, work-up is most consistent with likely mild viral COVID-19 pneumonia.  Presentation not consistent with deep space infection of the head or neck I have low suspicion for PE given D-dimer is less than 500 low suspicion for ACS given reassuring EKG and nonelevated troponin his duration of symptom onset greater than 6 hours.  No evidence of pneumothorax or volume overload on chest x-ray and patient does not appear volume overloaded on exam.  Presentation and troponin are not consistent with myocarditis.  CBC and CMP unremarkable watch for evidence of significant withdrawal or metabolic derangement or acute anemia.  Given reassuring vital signs with otherwise reassuring exam and work-up I believe patient safe for discharge with plan for outpatient follow-up.  Discharged stable condition.  Strict return precautions advised and discussed.  Isolation precautions discussed as well.   ___________________________________   FINAL CLINICAL IMPRESSION(S) / ED DIAGNOSES  Final diagnoses:  COVID-19  Pneumonia due  to COVID-19 virus    Medications - No data to display   ED Discharge Orders    None       Note:  This document was prepared using Dragon voice recognition software and may include unintentional dictation errors.   Gilles Chiquito, MD 03/19/20 2107

## 2020-03-20 ENCOUNTER — Telehealth: Payer: Self-pay | Admitting: Infectious Diseases

## 2020-03-20 ENCOUNTER — Other Ambulatory Visit: Payer: Self-pay | Admitting: Infectious Diseases

## 2020-03-20 DIAGNOSIS — Z6826 Body mass index (BMI) 26.0-26.9, adult: Secondary | ICD-10-CM

## 2020-03-20 DIAGNOSIS — U071 COVID-19: Secondary | ICD-10-CM

## 2020-03-20 DIAGNOSIS — G809 Cerebral palsy, unspecified: Secondary | ICD-10-CM

## 2020-03-20 NOTE — Progress Notes (Signed)
I connected by phone with Robin Hughes on 03/20/2020 at 4:58 PM to discuss the potential use of a new treatment for mild to moderate COVID-19 viral infection in non-hospitalized patients.  This patient is a 34 y.o. female that meets the FDA criteria for Emergency Use Authorization of COVID monoclonal antibody casirivimab/imdevimab or bamlanivimab/eteseviamb.  Has a (+) direct SARS-CoV-2 viral test result  Has mild or moderate COVID-19   Is NOT hospitalized due to COVID-19  Is within 10 days of symptom onset  Has at least one of the high risk factor(s) for progression to severe COVID-19 and/or hospitalization as defined in EUA.  Specific high risk criteria : BMI > 25 and Other high risk medical condition per CDC:  Cerebal palsy    I have spoken and communicated the following to the patient or parent/caregiver regarding COVID monoclonal antibody treatment:  1. FDA has authorized the emergency use for the treatment of mild to moderate COVID-19 in adults and pediatric patients with positive results of direct SARS-CoV-2 viral testing who are 40 years of age and older weighing at least 40 kg, and who are at high risk for progressing to severe COVID-19 and/or hospitalization.  2. The significant known and potential risks and benefits of COVID monoclonal antibody, and the extent to which such potential risks and benefits are unknown.  3. Information on available alternative treatments and the risks and benefits of those alternatives, including clinical trials.  4. Patients treated with COVID monoclonal antibody should continue to self-isolate and use infection control measures (e.g., wear mask, isolate, social distance, avoid sharing personal items, clean and disinfect "high touch" surfaces, and frequent handwashing) according to CDC guidelines.   5. The patient or parent/caregiver has the option to accept or refuse COVID monoclonal antibody treatment.  After reviewing this information with  the patient, the patient has agreed to receive one of the available covid 19 monoclonal antibodies and will be provided an appropriate fact sheet prior to infusion. Robin Alberts, NP 03/20/2020 4:58 PM

## 2020-03-20 NOTE — Telephone Encounter (Signed)
Called to Discuss with patient about Covid symptoms and the use of the monoclonal antibody infusion for those with mild to moderate Covid symptoms and at a high risk of hospitalization.     Pt appears to qualify for this infusion due to co-morbid conditions and/or a member of an at-risk group in accordance with the FDA Emergency Use Authorization.    Unable to reach pt - LVM and sent text message Her whole family is sick -  Mother is in the hospital and father is ill too.   Sx started early this week.  Qualifiers include CP, BMI > 25, SVI

## 2020-03-21 ENCOUNTER — Ambulatory Visit (HOSPITAL_COMMUNITY): Payer: Medicaid Other

## 2021-02-06 ENCOUNTER — Emergency Department
Admission: EM | Admit: 2021-02-06 | Discharge: 2021-02-06 | Disposition: A | Discharge status: Home / Self Care | Payer: Medicaid Other | Attending: Emergency Medicine | Admitting: Emergency Medicine

## 2021-02-06 ENCOUNTER — Other Ambulatory Visit: Payer: Self-pay

## 2021-02-06 DIAGNOSIS — R110 Nausea: Secondary | ICD-10-CM | POA: Diagnosis not present

## 2021-02-06 DIAGNOSIS — I10 Essential (primary) hypertension: Secondary | ICD-10-CM | POA: Diagnosis not present

## 2021-02-06 DIAGNOSIS — M7918 Myalgia, other site: Secondary | ICD-10-CM | POA: Insufficient documentation

## 2021-02-06 DIAGNOSIS — R000 Tachycardia, unspecified: Secondary | ICD-10-CM | POA: Insufficient documentation

## 2021-02-06 DIAGNOSIS — R509 Fever, unspecified: Secondary | ICD-10-CM | POA: Diagnosis not present

## 2021-02-06 DIAGNOSIS — Z20822 Contact with and (suspected) exposure to covid-19: Secondary | ICD-10-CM | POA: Insufficient documentation

## 2021-02-06 DIAGNOSIS — J029 Acute pharyngitis, unspecified: Secondary | ICD-10-CM | POA: Insufficient documentation

## 2021-02-06 LAB — RESP PANEL BY RT-PCR (FLU A&B, COVID) ARPGX2
Influenza A by PCR: NEGATIVE
Influenza B by PCR: NEGATIVE
SARS Coronavirus 2 by RT PCR: NEGATIVE

## 2021-02-06 LAB — GROUP A STREP BY PCR: Group A Strep by PCR: NOT DETECTED

## 2021-02-06 MED ORDER — DEXAMETHASONE 10 MG/ML FOR PEDIATRIC ORAL USE
10.0000 mg | Freq: Once | INTRAMUSCULAR | Status: AC
  Administered 2021-02-06: 10 mg via ORAL
  Filled 2021-02-06: qty 1

## 2021-02-06 NOTE — ED Triage Notes (Signed)
Pt to ER via Pov with complaints of generalized body aches and a sore throat. Reports symptoms have been ongoing for a month but her symptoms have worsened. Reports possible fevers at home.

## 2021-02-06 NOTE — ED Provider Notes (Signed)
[facility]-EMERGENCY DEPARTMENT  ____________________________________________  Time seen: Approximately 8:37 PM  I have reviewed the triage vital signs and the nursing notes.   HISTORY  Chief Complaint Generalized Body Aches and Sore Throat   Historian Patient     HPI Robin Hughes is a 35 y.o. female with a history of cerebral palsy, presents to the emergency department with pharyngitis that has occurred intermittently for the past month and low-grade fever that started yesterday.  Patient denies rhinorrhea, nasal congestion or nonproductive cough.  No chest pain, chest tightness or abdominal pain.  She does report some new nausea that started today.  No vomiting or diarrhea.  No sick contacts in her home with similar symptoms.  No other alleviating measures have been attempted.   Past Medical History:  Diagnosis Date   CP (cerebral palsy) (HCC)      Immunizations up to date:  Yes.     Past Medical History:  Diagnosis Date   CP (cerebral palsy) (HCC)     Patient Active Problem List   Diagnosis Date Noted   Indication for care in labor and delivery, antepartum 05/14/2019   Cerebral palsy (HCC) 11/27/2015   Restless leg syndrome 11/27/2015    Past Surgical History:  Procedure Laterality Date   BACK SURGERY      Prior to Admission medications   Medication Sig Start Date End Date Taking? Authorizing Provider  gabapentin (NEURONTIN) 100 MG capsule Take 100 mg by mouth at bedtime.    [provider]  oxyCODONE-acetaminophen (PERCOCET) 7.5-325 MG tablet Take 1 tablet by mouth every 6 (six) hours as needed. 10/28/19   Joni Reining, PA-C  sertraline (ZOLOFT) 25 MG tablet Take 25 mg by mouth daily.    [provider]  sulfamethoxazole-trimethoprim (BACTRIM DS) 800-160 MG tablet Take 1 tablet by mouth 2 (two) times daily. 10/28/19   Joni Reining, PA-C    Allergies Patient has no known allergies.  No family history on file.  Social  History Social History   Tobacco Use   Smoking status: Never   Smokeless tobacco: Never  Vaping Use   Vaping Use: Never used  Substance Use Topics   Alcohol use: No   Drug use: Never     Review of Systems  Constitutional:  Patient had low grade fever.  Eyes:  No discharge ENT: Patient has pharyngitis.  Respiratory: no cough. No SOB/ use of accessory muscles to breath Gastrointestinal:   No nausea, no vomiting.  No diarrhea.  No constipation. Musculoskeletal: Negative for musculoskeletal pain. Skin: Negative for rash, abrasions, lacerations, ecchymosis.   ____________________________________________   PHYSICAL EXAM:  VITAL SIGNS: ED Triage Vitals  Enc Vitals Group     BP 02/06/21 1801 (!) 161/101     Pulse Rate 02/06/21 1805 (!) 110     Resp 02/06/21 1801 18     Temp 02/06/21 1801 99.6 F (37.6 C)     Temp src --      SpO2 02/06/21 1801 100 %     Weight 02/06/21 1802 132 lb (59.9 kg)     Height 02/06/21 1802 4\' 11"  (1.499 m)     Head Circumference --      Peak Flow --      Pain Score 02/06/21 1802 10     Pain Loc --      Pain Edu? --      Excl. in GC? --      Constitutional: Alert and oriented. Patient is lying supine. Eyes:  Conjunctivae are normal. PERRL. EOMI. Head: Atraumatic. ENT:      Ears: Tympanic membranes are mildly injected with mild effusion bilaterally.       Nose: No congestion/rhinnorhea.      Mouth/Throat: Mucous membranes are moist. Posterior pharynx is mildly erythematous.  Hematological/Lymphatic/Immunilogical: No cervical lymphadenopathy.  Cardiovascular: Normal rate, regular rhythm. Normal S1 and S2.  Good peripheral circulation. Respiratory: Normal respiratory effort without tachypnea or retractions. Lungs CTAB. Good air entry to the bases with no decreased or absent breath sounds. Gastrointestinal: Bowel sounds 4 quadrants. Soft and nontender to palpation. No guarding or rigidity. No palpable masses. No distention. No CVA  tenderness. Musculoskeletal: Full range of motion to all extremities. No gross deformities appreciated. Neurologic:  Normal speech and language. No gross focal neurologic deficits are appreciated.  Skin:  Skin is warm, dry and intact. No rash noted. Psychiatric: Mood and affect are normal. Speech and behavior are normal. Patient exhibits appropriate insight and judgement.  ____________________________________________   LABS (all labs ordered are listed, but only abnormal results are displayed)  Labs Reviewed  GROUP A STREP BY PCR  RESP PANEL BY RT-PCR (FLU A&B, COVID) [id number]   ____________________________________________  EKG   ____________________________________________  RADIOLOGY   No results found.  ____________________________________________    PROCEDURES  Procedure(s) performed:     Procedures     Medications  dexamethasone (DECADRON) 10 MG/ML injection for Pediatric ORAL use 10 mg (has no administration in time range)     ____________________________________________   INITIAL IMPRESSION / ASSESSMENT AND PLAN / ED COURSE  Pertinent labs & imaging results that were available during my care of the patient were reviewed by me and considered in my medical decision making (see chart for details).      Assessment and plan Pharyngitis 35 year old female presents to the emergency department with pharyngitis that is occurred intermittently for the past month and low-grade fever that started today.  Patient was hypertensive at triage and mildly tachycardic but vital signs were otherwise reassuring.  Patient was alert, active and nontoxic-appearing.  She was able to speak in complete sentences and manage her own secretions.  Patient tested negative for group A strep, COVID-19 and influenza.  Suspect viral pharyngitis.  Patient was given oral Decadron in the emergency department prior to discharge and was discharged with Zofran for her nausea.  Return  precautions were given to return with new or worsening symptoms.  All patient questions were answered.     ____________________________________________  FINAL CLINICAL IMPRESSION(S) / ED DIAGNOSES  Final diagnoses:  None      NEW MEDICATIONS STARTED DURING THIS VISIT:  ED Discharge Orders     None           This chart was dictated using voice recognition software/Dragon. Despite best efforts to proofread, errors can occur which can change the meaning. Any change was purely unintentional.     Gasper Lloyd 02/06/21 2039    Gilles Chiquito, MD 02/06/21 2041

## 2021-02-06 NOTE — Discharge Instructions (Addendum)
You can take Tylenol and Ibuprofen alternating for pharyngitis.

## 2021-03-22 ENCOUNTER — Emergency Department
Admission: EM | Admit: 2021-03-22 | Discharge: 2021-03-22 | Disposition: A | Discharge status: Home / Self Care | Payer: Medicaid Other | Attending: Emergency Medicine | Admitting: Emergency Medicine

## 2021-03-22 ENCOUNTER — Other Ambulatory Visit: Payer: Self-pay

## 2021-03-22 ENCOUNTER — Emergency Department: Payer: Medicaid Other

## 2021-03-22 DIAGNOSIS — R079 Chest pain, unspecified: Secondary | ICD-10-CM | POA: Diagnosis present

## 2021-03-22 DIAGNOSIS — R000 Tachycardia, unspecified: Secondary | ICD-10-CM | POA: Insufficient documentation

## 2021-03-22 LAB — D-DIMER, QUANTITATIVE: D-Dimer, Quant: 0.42 ug/mL-FEU (ref 0.00–0.50)

## 2021-03-22 LAB — CBC
HCT: 43.4 % (ref 36.0–46.0)
Hemoglobin: 14.3 g/dL (ref 12.0–15.0)
MCH: 29.2 pg (ref 26.0–34.0)
MCHC: 32.9 g/dL (ref 30.0–36.0)
MCV: 88.6 fL (ref 80.0–100.0)
Platelets: 219 10*3/uL (ref 150–400)
RBC: 4.9 MIL/uL (ref 3.87–5.11)
RDW: 13.7 % (ref 11.5–15.5)
WBC: 12.6 10*3/uL — ABNORMAL HIGH (ref 4.0–10.5)
nRBC: 0 % (ref 0.0–0.2)

## 2021-03-22 LAB — BASIC METABOLIC PANEL
Anion gap: 6 (ref 5–15)
BUN: 9 mg/dL (ref 6–20)
CO2: 24 mmol/L (ref 22–32)
Calcium: 9.7 mg/dL (ref 8.9–10.3)
Chloride: 107 mmol/L (ref 98–111)
Creatinine, Ser: 0.69 mg/dL (ref 0.44–1.00)
GFR, Estimated: >60 mL/min (ref >60)
Glucose, Bld: 122 mg/dL — ABNORMAL HIGH (ref 70–99)
Potassium: 3.9 mmol/L (ref 3.5–5.1)
Sodium: 137 mmol/L (ref 135–145)

## 2021-03-22 LAB — TROPONIN I (HIGH SENSITIVITY)
Troponin I (High Sensitivity): 5 ng/L (ref <18)
Troponin I (High Sensitivity): 4 ng/L (ref <18)

## 2021-03-22 LAB — POC URINE PREG, ED: Preg Test, Ur: NEGATIVE

## 2021-03-22 MED ORDER — ALUM & MAG HYDROXIDE-SIMETH 200-200-20 MG/5ML PO SUSP
30.0000 mL | Freq: Once | ORAL | Status: AC
  Administered 2021-03-22: 30 mL via ORAL
  Filled 2021-03-22: qty 30

## 2021-03-22 MED ORDER — LIDOCAINE VISCOUS HCL 2 % MT SOLN
15.0000 mL | Freq: Once | OROMUCOSAL | Status: AC
  Administered 2021-03-22: 15 mL via ORAL
  Filled 2021-03-22: qty 15

## 2021-03-22 MED ORDER — HYDROXYZINE HCL 25 MG PO TABS
25.0000 mg | ORAL_TABLET | Freq: Once | ORAL | Status: AC
  Administered 2021-03-22: 25 mg via ORAL
  Filled 2021-03-22: qty 1

## 2021-03-22 MED ORDER — HYDROXYZINE HCL 10 MG PO TABS
10.0000 mg | ORAL_TABLET | Freq: Three times a day (TID) | ORAL | 0 refills | Status: DC | PRN
Start: 2021-03-22 — End: 2022-07-26

## 2021-03-22 NOTE — ED Notes (Signed)
Call lab for repeat trop pt a difficult stick

## 2021-03-22 NOTE — ED Triage Notes (Signed)
Pt comes via EMS with c/o CP. Pt has hx of cerebral palsy. Pt states CP and back pain that started today. Pt states the pain radiates to back . VSS per EMs

## 2021-03-22 NOTE — ED Provider Notes (Signed)
[facility] Emergency Department Provider Note _______________________________________   I have reviewed the triage vital signs and the nursing notes.   HISTORY  Chief Complaint Chest Pain   History limited by: Not Limited   HPI Robin Hughes is a 35 y.o. female who presents to the emergency department today because of concern for chest pain. The patient states that the pain is located in the center of her chest. The pain started today. She does state that the pain did go to her back as well as her left arm. She is worried that she might also be having anxiety. States that she has a history of anxiety and depression however has not been on her medication for a long time. The patient denies any fevers.    Records reviewed. Per medical record review patient has a history of cerebral palsy, depression.  Past Medical History:  Diagnosis Date   CP (cerebral palsy) (HCC)     Patient Active Problem List   Diagnosis Date Noted   Indication for care in labor and delivery, antepartum 05/14/2019   Cerebral palsy (HCC) 11/27/2015   Restless leg syndrome 11/27/2015    Past Surgical History:  Procedure Laterality Date   BACK SURGERY      Prior to Admission medications   Medication Sig Start Date End Date Taking? Authorizing Provider  gabapentin (NEURONTIN) 100 MG capsule Take 100 mg by mouth at bedtime.    [provider]  oxyCODONE-acetaminophen (PERCOCET) 7.5-325 MG tablet Take 1 tablet by mouth every 6 (six) hours as needed. 10/28/19   Joni Reining, PA-C  sertraline (ZOLOFT) 25 MG tablet Take 25 mg by mouth daily.    [provider]  sulfamethoxazole-trimethoprim (BACTRIM DS) 800-160 MG tablet Take 1 tablet by mouth 2 (two) times daily. 10/28/19   Joni Reining, PA-C    Allergies Patient has no known allergies.  No family history on file.  Social History Social History   Tobacco Use   Smoking status: Never   Smokeless tobacco:  Never  Vaping Use   Vaping Use: Never used  Substance Use Topics   Alcohol use: No   Drug use: Never    Review of Systems Constitutional: No fever/chills Eyes: No visual changes. ENT: No sore throat. Cardiovascular: Positive for chest pain. Respiratory: Denies shortness of breath. Gastrointestinal: No abdominal pain.  No nausea, no vomiting.  No diarrhea.   Genitourinary: Negative for dysuria. Musculoskeletal: Positive for back pain. Positive for left arm pain. Skin: Negative for rash. Neurological: Negative for headaches, focal weakness or numbness.  ____________________________________________   PHYSICAL EXAM:  VITAL SIGNS: ED Triage Vitals  Enc Vitals Group     BP 03/22/21 1518 (!) 146/106     Pulse Rate 03/22/21 1518 (!) 102     Resp 03/22/21 1518 16     Temp 03/22/21 1518 98.3 F (36.8 C)     Temp Source 03/22/21 1518 Oral     SpO2 03/22/21 1518 100 %     Weight 03/22/21 1517 133 lb (60.3 kg)     Height 03/22/21 1517 4\' 11"  (1.499 m)     Head Circumference --      Peak Flow --      Pain Score 03/22/21 1516 7   Constitutional: Alert and oriented.  Eyes: Conjunctivae are normal.  ENT      Head: Normocephalic and atraumatic.      Nose: No congestion/rhinnorhea.      Mouth/Throat: Mucous membranes are moist.  Neck: No stridor. Hematological/Lymphatic/Immunilogical: No cervical lymphadenopathy. Cardiovascular: Tachycardic, regular rhythm.  No murmurs, rubs, or gallops.  Respiratory: Normal respiratory effort without tachypnea nor retractions. Breath sounds are clear and equal bilaterally. No wheezes/rales/rhonchi. Gastrointestinal: Soft and non tender. No rebound. No guarding.  Genitourinary: Deferred Musculoskeletal: Normal range of motion in all extremities. No lower extremity edema. Neurologic:  Normal speech and language.  Skin:  Skin is warm, dry and intact. No rash noted. Psychiatric: Mood and affect are normal. Speech and behavior are normal. Patient  exhibits appropriate insight and judgment.  ____________________________________________    LABS (pertinent positives/negatives)  Upreg negative Trop hs 5 CBC wbc 12.6, hgb 14.3, plt 219 BMP wnl except glu 122  ____________________________________________   EKG  I, Phineas Semen, attending physician, personally viewed and interpreted this EKG  EKG Time: 1520 Rate: 100 Rhythm: normal sinus rhythm Axis: normal Intervals: qtc 454 QRS: narrow ST changes: no st elevation Impression: normal ekg  ____________________________________________    RADIOLOGY  CXR No acute cardiopulmonary disease  ____________________________________________   PROCEDURES  Procedures  ____________________________________________   INITIAL IMPRESSION / ASSESSMENT AND PLAN / ED COURSE  Pertinent labs & imaging results that were available during my care of the patient were reviewed by me and considered in my medical decision making (see chart for details).   Patient presents to the emergency department today because of concern for chest pain. Ekg cxr and blood work without concerning abnormalities nor obvious etiology of the patients pain. At this time doubt acs, pe, pna, ptx, dissection. She did feel somewhat better after atarax. Does have history of anxiety, so do have some thought that stress and anxiety could be playing a role. Discussed this with the patient. Given that she felt better will plan on discharging, will give prescription for atarax.   ____________________________________________   FINAL CLINICAL IMPRESSION(S) / ED DIAGNOSES  Final diagnoses:  Nonspecific chest pain     Note: This dictation was prepared with Dragon dictation. Any transcriptional errors that result from this process are unintentional     Phineas Semen, MD 03/22/21 2255

## 2021-03-22 NOTE — Discharge Instructions (Addendum)
Please seek medical attention for any high fevers, chest pain, shortness of breath, change in behavior, persistent vomiting, bloody stool or any other new or concerning symptoms.  

## 2021-03-28 ENCOUNTER — Other Ambulatory Visit: Payer: Self-pay

## 2021-03-28 ENCOUNTER — Emergency Department
Admission: EM | Admit: 2021-03-28 | Discharge: 2021-03-28 | Disposition: A | Discharge status: Home / Self Care | Payer: Medicaid Other | Attending: Emergency Medicine | Admitting: Emergency Medicine

## 2021-03-28 ENCOUNTER — Emergency Department: Payer: Medicaid Other

## 2021-03-28 ENCOUNTER — Encounter: Payer: Self-pay | Admitting: Emergency Medicine

## 2021-03-28 DIAGNOSIS — R202 Paresthesia of skin: Secondary | ICD-10-CM | POA: Insufficient documentation

## 2021-03-28 DIAGNOSIS — F419 Anxiety disorder, unspecified: Secondary | ICD-10-CM | POA: Diagnosis not present

## 2021-03-28 DIAGNOSIS — R000 Tachycardia, unspecified: Secondary | ICD-10-CM | POA: Diagnosis not present

## 2021-03-28 DIAGNOSIS — R002 Palpitations: Secondary | ICD-10-CM | POA: Diagnosis not present

## 2021-03-28 DIAGNOSIS — R079 Chest pain, unspecified: Secondary | ICD-10-CM | POA: Diagnosis present

## 2021-03-28 DIAGNOSIS — R0789 Other chest pain: Secondary | ICD-10-CM | POA: Insufficient documentation

## 2021-03-28 LAB — BASIC METABOLIC PANEL
Anion gap: 7 (ref 5–15)
BUN: 8 mg/dL (ref 6–20)
CO2: 23 mmol/L (ref 22–32)
Calcium: 9.7 mg/dL (ref 8.9–10.3)
Chloride: 108 mmol/L (ref 98–111)
Creatinine, Ser: 0.54 mg/dL (ref 0.44–1.00)
GFR, Estimated: >60 mL/min (ref >60)
Glucose, Bld: 120 mg/dL — ABNORMAL HIGH (ref 70–99)
Potassium: 3.5 mmol/L (ref 3.5–5.1)
Sodium: 138 mmol/L (ref 135–145)

## 2021-03-28 LAB — CBC
HCT: 39.1 % (ref 36.0–46.0)
Hemoglobin: 13.4 g/dL (ref 12.0–15.0)
MCH: 28.7 pg (ref 26.0–34.0)
MCHC: 34.3 g/dL (ref 30.0–36.0)
MCV: 83.7 fL (ref 80.0–100.0)
Platelets: 306 10*3/uL (ref 150–400)
RBC: 4.67 MIL/uL (ref 3.87–5.11)
RDW: 13.4 % (ref 11.5–15.5)
WBC: 11.8 10*3/uL — ABNORMAL HIGH (ref 4.0–10.5)
nRBC: 0 % (ref 0.0–0.2)

## 2021-03-28 LAB — TROPONIN I (HIGH SENSITIVITY): Troponin I (High Sensitivity): 4 ng/L (ref <18)

## 2021-03-28 MED ORDER — HYDROXYZINE HCL 25 MG PO TABS
25.0000 mg | ORAL_TABLET | Freq: Once | ORAL | Status: AC
  Administered 2021-03-28: 25 mg via ORAL
  Filled 2021-03-28: qty 1

## 2021-03-28 NOTE — Discharge Instructions (Addendum)
Stop taking the Prozac for now, and call your doctors office tomorrow to let them know that you feel worse with the Prozac and that you may need to be changed to a different medication.  You may continue to take the Atarax as needed for anxiety.  You can actually take 2 tablets (20 mg) 3 times daily if the 10 mg single tablet is not working.  Return to the ER for any new or worsening chest pain, difficulty breathing, palpitations, weakness or lightheadedness, or any other new or worsening symptoms that concern you.

## 2021-03-28 NOTE — ED Provider Notes (Addendum)
[facility] Emergency Department Provider Note ____________________________________________   Event Date/Time   First MD Initiated Contact with Patient 03/28/21 1552     (approximate)  I have reviewed the triage vital signs and the nursing notes.   HISTORY  Chief Complaint Chest Pain    HPI Robin Hughes is a 35 y.o. female with PMH as noted below including cerebral palsy who presents with anxiety and chest pain, persistent course over at least the last several days although patient states he has a prior history of anxiety and has not been on medication for it.  The patient states she was seen for this in the ER last week and given Atarax which helped a little bit.  She followed up with her doctor and was started on Prozac 2 days ago, but states that it made her feel worse.  The pain is intermittent and located in the left side of her chest.  It is associated with anxiety, palpitations, and tingling/numbness to her face and both arms and legs.  She states that the symptoms have been coming and going, but are the same as what she came to the ED with last week.   Past Medical History:  Diagnosis Date   CP (cerebral palsy) (HCC)     Patient Active Problem List   Diagnosis Date Noted   Indication for care in labor and delivery, antepartum 05/14/2019   Cerebral palsy (HCC) 11/27/2015   Restless leg syndrome 11/27/2015    Past Surgical History:  Procedure Laterality Date   BACK SURGERY      Prior to Admission medications   Medication Sig Start Date End Date Taking? Authorizing Provider  gabapentin (NEURONTIN) 100 MG capsule Take 100 mg by mouth at bedtime.    [provider]  hydrOXYzine (ATARAX/VISTARIL) 10 MG tablet Take 1 tablet (10 mg total) by mouth 3 (three) times daily as needed for anxiety. 03/22/21   Phineas Semen, MD  oxyCODONE-acetaminophen (PERCOCET) 7.5-325 MG tablet Take 1 tablet by mouth every 6 (six) hours as needed. 10/28/19    Joni Reining, PA-C  sertraline (ZOLOFT) 25 MG tablet Take 25 mg by mouth daily.    [provider]  sulfamethoxazole-trimethoprim (BACTRIM DS) 800-160 MG tablet Take 1 tablet by mouth 2 (two) times daily. 10/28/19   Joni Reining, PA-C    Allergies Patient has no known allergies.  No family history on file.  Social History Social History   Tobacco Use   Smoking status: Never   Smokeless tobacco: Never  Vaping Use   Vaping Use: Never used  Substance Use Topics   Alcohol use: No   Drug use: Never    Review of Systems  Constitutional: No fever/chills Eyes: No visual changes. ENT: No sore throat. Cardiovascular: Positive for intermittent chest pain. Respiratory: Denies shortness of breath. Gastrointestinal: No nausea, no vomiting.  No diarrhea.  Genitourinary: Negative for dysuria.  Musculoskeletal: Negative for back pain. Skin: Negative for rash. Neurological: Negative for headaches, focal weakness or numbness.   ____________________________________________   PHYSICAL EXAM:  VITAL SIGNS: ED Triage Vitals  Enc Vitals Group     BP 03/28/21 1418 (!) 145/105     Pulse Rate 03/28/21 1418 (!) 106     Resp 03/28/21 1418 18     Temp 03/28/21 1418 98.8 F (37.1 C)     Temp Source 03/28/21 1418 Oral     SpO2 03/28/21 1418 99 %     Weight 03/28/21 1342 135 lb (  61.2 kg)     Height 03/28/21 1342 4\' 11"  (1.499 m)     Head Circumference --      Peak Flow --      Pain Score 03/28/21 1341 9     Pain Loc --      Pain Edu? --      Excl. in GC? --     Constitutional: Alert and oriented. Well appearing and in no acute distress. Eyes: Conjunctivae are normal.  Head: Atraumatic. Nose: No congestion/rhinnorhea. Mouth/Throat: Mucous membranes are moist.   Neck: Normal range of motion.  Cardiovascular: Borderline tachycardic, regular rhythm. Grossly normal heart sounds.  Good peripheral circulation. Respiratory: Normal respiratory effort.  No retractions. Lungs  CTAB. Gastrointestinal: No distention.  Musculoskeletal: No lower extremity edema.  No calf or popliteal swelling or tenderness.  Extremities warm and well perfused.  Neurologic:  Normal language. No acute focal neurologic deficits are appreciated.  Skin:  Skin is warm and dry. No rash noted. Psychiatric: Anxious appearing. ____________________________________________   LABS (all labs ordered are listed, but only abnormal results are displayed)  Labs Reviewed  BASIC METABOLIC PANEL - Abnormal; Notable for the following components:      Result Value   Glucose, Bld 120 (*)    All other components within normal limits  CBC - Abnormal; Notable for the following components:   WBC 11.8 (*)    All other components within normal limits  TROPONIN I (HIGH SENSITIVITY)   ____________________________________________  EKG  ED ECG REPORT I, 05/28/21, the attending physician, personally viewed and interpreted this ECG.  Date: 03/28/2021 EKG Time: 1410 Rate: 111 Rhythm: Sinus tachycardia QRS Axis: normal Intervals: normal ST/T Wave abnormalities: normal Narrative Interpretation: no evidence of acute ischemia  ____________________________________________  RADIOLOGY  Chest x-ray interpreted by me shows no focal consolidation or edema  ____________________________________________   PROCEDURES  Procedure(s) performed: No  Procedures  Critical Care performed: No ____________________________________________   INITIAL IMPRESSION / ASSESSMENT AND PLAN / ED COURSE  Pertinent labs & imaging results that were available during my care of the patient were reviewed by me and considered in my medical decision making (see chart for details).   35 year old female with a history of cerebral palsy presents with anxiety and chest pain.  The chest pain has been intermittent over the last week and the anxiety is longstanding although the patient has not been on medication for it in a  while.  She was started on Prozac a few days ago but states she is feeling worse.  I reviewed the past medical records in Epic.  The patient was seen in the ED on 9/26 for the symptoms and treated successfully with Atarax.  She had a chest x-ray, troponins x2, and a D-dimer all of which were within normal limits.  She was subsequently seen by Dr. 10/26 on 9/29 and diagnosed with likely anxiety.  She was started on Prozac.  On exam today, the patient is overall well-appearing.  She is slightly hypertensive and borderline tachycardic with otherwise normal vital signs.  She does appear quite anxious, and is asking numerous questions about whether the high heart rate or chest pain could cause her to die.  However her thought is organized, she is asking appropriate questions and does not demonstrate any concerning acute psychiatric symptoms.  Physical exam is otherwise unremarkable.  Lab work-up obtained from triage is within normal limits including a negative troponin.  Chest x-ray and EKG are also normal except for sinus tachycardia.  Overall presentation is consistent with anxiety and nonspecific likely musculoskeletal chest pain.  There continues to be no clinical evidence for ACS in this young patient without cardiac risk factors.  There is no evidence of aortic dissection or other vascular etiology given the association with anxiety and intermittent nature of the symptoms.  The patient has no signs or symptoms of DVT, and had a negative D-dimer for the same pain a few days ago.  There is no indication for a repeat troponin or a new D-dimer today.  The mild tachycardia is consistent with her ongoing anxiety.  At this time, the patient is stable for discharge home.  We will give a dose of Atarax here.  She is only on 10 mg at home so I told her she could take 20 mg per dose.  I advised that if she is feeling worse on the Prozac she should discontinue it and call her PMD tomorrow to potentially discuss being  changed to a different medication.  The patient agrees with this plan.  Return precautions given, and she expresses understanding.    ____________________________________________   FINAL CLINICAL IMPRESSION(S) / ED DIAGNOSES  Final diagnoses:  Anxiety  Atypical chest pain      NEW MEDICATIONS STARTED DURING THIS VISIT:  New Prescriptions   No medications on file     Note:  This document was prepared using Dragon voice recognition software and may include unintentional dictation errors.    Dionne Bucy, MD 03/28/21 1639    Dionne Bucy, MD 03/28/21 [phone]

## 2021-03-28 NOTE — ED Triage Notes (Signed)
Pt reports has some CP. Pt states went to her MD Monday and they put heron prozac but is has made her chest pain worse. Pt reports also feels like her heart is racing.

## 2021-03-28 NOTE — ED Provider Notes (Signed)
Emergency Medicine Provider Triage Evaluation Note  Robin Hughes , a 35 y.o. female  was evaluated in triage.  Pt complains of chest pain, shortness of breath, left arm numbness.  She denies headache, dizziness, nausea, vomiting or diarrhea.  She has cerebral palsy, slurred speech and posturing at baseline  Review of Systems  Positive: Chest pain, shortness of breath, left arm numbness Negative: Headache, dizziness, unilateral weakness, fever, chills, body aches  Physical Exam  Ht 4\' 11"  (1.499 m)   Wt 61.2 kg   BMI 27.27 kg/m  Gen:   Awake, crying, in NAD Resp:  Tachypneic Neuro:  Slurred speech and posturing at baseline Psych:  Anxious appearing    Medical Decision Making  Medically screening exam initiated at 1:45 PM.  Appropriate orders placed.  Robin Hughes was informed that the remainder of the evaluation will be completed by another provider, this initial triage assessment does not replace that evaluation, and the importance of remaining in the ED until their evaluation is complete.     , NP 03/28/21 1349    05/28/21, MD 03/28/21 [phone]

## 2021-03-30 ENCOUNTER — Emergency Department: Payer: Medicaid Other

## 2021-03-30 ENCOUNTER — Emergency Department
Admission: EM | Admit: 2021-03-30 | Discharge: 2021-03-30 | Disposition: A | Discharge status: Home / Self Care | Payer: Medicaid Other | Attending: Emergency Medicine | Admitting: Emergency Medicine

## 2021-03-30 ENCOUNTER — Other Ambulatory Visit: Payer: Self-pay

## 2021-03-30 DIAGNOSIS — R0789 Other chest pain: Secondary | ICD-10-CM | POA: Insufficient documentation

## 2021-03-30 DIAGNOSIS — R102 Pelvic and perineal pain: Secondary | ICD-10-CM | POA: Diagnosis present

## 2021-03-30 DIAGNOSIS — N939 Abnormal uterine and vaginal bleeding, unspecified: Secondary | ICD-10-CM | POA: Diagnosis not present

## 2021-03-30 LAB — CBC
HCT: 41 % (ref 36.0–46.0)
Hemoglobin: 14 g/dL (ref 12.0–15.0)
MCH: 28.8 pg (ref 26.0–34.0)
MCHC: 34.1 g/dL (ref 30.0–36.0)
MCV: 84.4 fL (ref 80.0–100.0)
Platelets: 196 10*3/uL (ref 150–400)
RBC: 4.86 MIL/uL (ref 3.87–5.11)
RDW: 13.4 % (ref 11.5–15.5)
WBC: 11.5 10*3/uL — ABNORMAL HIGH (ref 4.0–10.5)
nRBC: 0 % (ref 0.0–0.2)

## 2021-03-30 LAB — BASIC METABOLIC PANEL
Anion gap: 7 (ref 5–15)
BUN: 8 mg/dL (ref 6–20)
CO2: 24 mmol/L (ref 22–32)
Calcium: 9.6 mg/dL (ref 8.9–10.3)
Chloride: 107 mmol/L (ref 98–111)
Creatinine, Ser: 0.59 mg/dL (ref 0.44–1.00)
GFR, Estimated: >60 mL/min (ref >60)
Glucose, Bld: 122 mg/dL — ABNORMAL HIGH (ref 70–99)
Potassium: 3.8 mmol/L (ref 3.5–5.1)
Sodium: 138 mmol/L (ref 135–145)

## 2021-03-30 LAB — URINALYSIS, ROUTINE W REFLEX MICROSCOPIC
Bacteria, UA: NONE SEEN
Bilirubin Urine: NEGATIVE
Glucose, UA: NEGATIVE mg/dL
Ketones, ur: 5 mg/dL — AB
Leukocytes,Ua: NEGATIVE
Nitrite: NEGATIVE
Protein, ur: NEGATIVE mg/dL
Specific Gravity, Urine: 1.006 (ref 1.005–1.030)
pH: 6 (ref 5.0–8.0)

## 2021-03-30 LAB — POC URINE PREG, ED: Preg Test, Ur: NEGATIVE

## 2021-03-30 LAB — TROPONIN I (HIGH SENSITIVITY): Troponin I (High Sensitivity): 5 ng/L (ref <18)

## 2021-03-30 NOTE — ED Triage Notes (Addendum)
Pt arrived to ed via ems from home  for c/o vaginal bleeding x 8 months, reported 6 pads per day, CP constant for last 8 months. with hx of cerebral palsy. Feels like heart skipping beats.   Ems vitals  160/90 70's HR 100% RA

## 2021-03-30 NOTE — ED Notes (Signed)
Pt returned from Ultrasound.

## 2021-03-30 NOTE — ED Notes (Signed)
Pt brought to triage for an ekg due to partial complaint of chest pain, when pt asked about her chest pain she states that she has been seen three times for the same issue and "no one has done anything about it" pt states that her brother died at her age of a heart attack and states that she keeps being diagnosed with anxiety. Pt states that she is very concerned with the implant in her right arm and thinks she may have an infection, pt is unclear as to where she thinks the infection is despite asking her, pt states that she has been bleeding every day for the past 8 months and thinks it related to the implant in her arm

## 2021-03-30 NOTE — ED Notes (Signed)
DC instructions reviewed with pt. Pt will call cardiology tomorrow for follow up appointment. Pt verbalized understanding. Soda provided to pt. Pt taken to waiting area via wheelchair to wait on her ride. Denies any other needs at this time.

## 2021-03-30 NOTE — ED Notes (Signed)
Pt used walked to come to nurses station. Pt states she is scared. This nurse assisted patient back to room. Pt requested to have door remained opened. Pt reassured of labs and vital signs. Pt states her brother died when he was her age and she is scared that it will happen to her too. Blanket provided to pt. Call bell in reach.

## 2021-03-30 NOTE — ED Triage Notes (Signed)
Pt here with vaginal bleeding and CP. Pt states that she has been bleeding for 6-8 months intermittently. Pt thinks that it may be related to the birth control implant in her right arm. Pt also thinks that she may have a UTI. Pt states the CP is left sided and radiates to her back.

## 2021-03-30 NOTE — ED Provider Notes (Signed)
[facility] Emergency Department Provider Note  ____________________________________________  Time seen: Approximately 3:36 PM  I have reviewed the triage vital signs and the nursing notes.   HISTORY  Chief Complaint Vaginal Bleeding and Chest Pain    HPI Robin Hughes is a 35 y.o. female who presents emergency department with multiple medical complaints.  Patient has been seen here multiple times recently for chest pain.  She was seen by her primary care yesterday for also a complaint of chest pain.  Her work-ups have been reassuring and patient has been diagnosed with anxiety.  Patient states that she does not believe its anxiety has family members have had heart issues and "I know there is something going on with my heart."  Patient denies any pain currently.  No shortness of breath.  She states that last night she woke up with her heart racing.  Patient denies any fevers or chills, nasal congestion, sore throat, cough.  Patient is also complaining of abnormal vaginal bleeding and pelvic pain.  She states that she has had off-and-on bleeding for 8 months and she thinks it is due to the Nexplanon in her arm.  Patient states that she is now having pelvic pain.  There is no vaginal discharge other than bleeding.  There is no dysuria, polyuria or hematuria.  No GI complaints.  Patient has not followed up with OB/GYN for this complaint.  She states that she has had Nexplanon for greater than 10 years       Past Medical History:  Diagnosis Date   CP (cerebral palsy) (HCC)     Patient Active Problem List   Diagnosis Date Noted   Indication for care in labor and delivery, antepartum 05/14/2019   Cerebral palsy (HCC) 11/27/2015   Restless leg syndrome 11/27/2015    Past Surgical History:  Procedure Laterality Date   BACK SURGERY      Prior to Admission medications   Medication Sig Start Date End Date Taking? Authorizing Provider  gabapentin (NEURONTIN)  100 MG capsule Take 100 mg by mouth at bedtime.    [provider]  hydrOXYzine (ATARAX/VISTARIL) 10 MG tablet Take 1 tablet (10 mg total) by mouth 3 (three) times daily as needed for anxiety. 03/22/21   Phineas Semen, MD  oxyCODONE-acetaminophen (PERCOCET) 7.5-325 MG tablet Take 1 tablet by mouth every 6 (six) hours as needed. 10/28/19   Joni Reining, PA-C  sertraline (ZOLOFT) 25 MG tablet Take 25 mg by mouth daily.    [provider]  sulfamethoxazole-trimethoprim (BACTRIM DS) 800-160 MG tablet Take 1 tablet by mouth 2 (two) times daily. 10/28/19   Joni Reining, PA-C    Allergies Patient has no known allergies.  No family history on file.  Social History Social History   Tobacco Use   Smoking status: Never   Smokeless tobacco: Never  Vaping Use   Vaping Use: Never used  Substance Use Topics   Alcohol use: No   Drug use: Never     Review of Systems  Constitutional: No fever/chills Eyes: No visual changes. No discharge ENT: No upper respiratory complaints. Cardiovascular: Intermittent chest pain x8 months Respiratory: no cough. No SOB. Gastrointestinal: No abdominal pain.  No nausea, no vomiting.  No diarrhea.  No constipation. Genitourinary: Negative for dysuria. No hematuria.  Vaginal bleeding x8 months Musculoskeletal: Negative for musculoskeletal pain. Skin: Negative for rash, abrasions, lacerations, ecchymosis. Neurological: Negative for headaches, focal weakness or numbness.  10 System ROS otherwise negative.  ____________________________________________  PHYSICAL EXAM:  VITAL SIGNS: ED Triage Vitals  Enc Vitals Group     BP 03/30/21 1239 (!) 145/101     Pulse Rate 03/30/21 1239 65     Resp 03/30/21 1239 17     Temp 03/30/21 1239 98.2 F (36.8 C)     Temp Source 03/30/21 1239 Oral     SpO2 03/30/21 1239 98 %     Weight 03/30/21 1240 134 lb 14.7 oz (61.2 kg)     Height 03/30/21 1240 4\' 11"  (1.499 m)     Head Circumference --       Peak Flow --      Pain Score 03/30/21 1240 8     Pain Loc --      Pain Edu? --      Excl. in GC? --      Constitutional: Alert and oriented. Well appearing and in no acute distress. Eyes: Conjunctivae are normal. PERRL. EOMI. Head: Atraumatic. ENT:      Ears:       Nose: No congestion/rhinnorhea.      Mouth/Throat: Mucous membranes are moist.  Neck: No stridor.    Cardiovascular: Normal rate, regular rhythm. Normal S1 and S2.  Good peripheral circulation. Respiratory: Normal respiratory effort without tachypnea or retractions. Lungs CTAB. Good air entry to the bases with no decreased or absent breath sounds. Gastrointestinal: Bowel sounds 4 quadrants.  Soft to palpation all quadrants.  Tender to palpation in the suprapubic region.. No guarding or rigidity. No palpable masses. No distention. No CVA tenderness. Musculoskeletal: Full range of motion to all extremities. No gross deformities appreciated. Neurologic:  Normal speech and language. No gross focal neurologic deficits are appreciated.  Skin:  Skin is warm, dry and intact. No rash noted. Psychiatric: Mood and affect are normal. Speech and behavior are normal. Patient exhibits appropriate insight and judgement.   ____________________________________________   LABS (all labs ordered are listed, but only abnormal results are displayed)  Labs Reviewed  BASIC METABOLIC PANEL - Abnormal; Notable for the following components:      Result Value   Glucose, Bld 122 (*)    All other components within normal limits  CBC - Abnormal; Notable for the following components:   WBC 11.5 (*)    All other components within normal limits  URINALYSIS, ROUTINE W REFLEX MICROSCOPIC - Abnormal; Notable for the following components:   Color, Urine STRAW (*)    APPearance CLEAR (*)    Hgb urine dipstick LARGE (*)    Ketones, ur 5 (*)    All other components within normal limits  POC URINE PREG, ED  TROPONIN I (HIGH SENSITIVITY)  TROPONIN I  (HIGH SENSITIVITY)   ____________________________________________  EKG   ____________________________________________  RADIOLOGY I personally viewed and evaluated these images as part of my medical decision making, as well as reviewing the written report by the radiologist.  ED Provider Interpretation: Chest x-ray is reassuring with no acute findings.  Ultrasound of pelvis reveals a localized thickening of the endometrium which could be a polyp.    DG Chest 2 View  Result Date: 03/30/2021 CLINICAL DATA:  Chest pain EXAM: CHEST - 2 VIEW COMPARISON:  Chest radiograph 03/28/2021 FINDINGS: The cardiomediastinal silhouette is normal. The lungs clear, without focal consolidation or pulmonary edema. There is no pleural effusion or pneumothorax The bones are stable. IMPRESSION: Stable chest with no radiographic evidence of acute cardiopulmonary process. Electronically Signed   By: 05/28/2021 M.D.   On: 03/30/2021 14:07  US PELVIC COMPLETE WITH TRANSVAGINAL  Result Date: 03/30/2021 CLINICAL DATA:  Vaginal bleeding for 8 months. EXAM: TRANSABDOMINAL AND TRANSVAGINAL ULTRASOUND OF PELVIS TECHNIQUE: Both transabdominal and transvaginal ultrasound examinations of the pelvis were performed. Transabdominal technique was performed for global imaging of the pelvis including uterus, ovaries, adnexal regions, and pelvic cul-de-sac. It was necessary to proceed with endovaginal exam following the transabdominal exam to visualize the uterus, endometrium, ovaries, and adnexa. COMPARISON:  OB ultrasound dated December 28, 2018. FINDINGS: Uterus Measurements: 7.1 x 2.8 x 4.2 cm = volume: 44 mL. No fibroids or other mass visualized. Endometrium Focal hyperechoic endometrial thickening in the lower urine segment measuring up to 7 mm. Right ovary Measurements: 3.5 x 2.0 x 2.6 cm = volume: 9 mL. Normal appearance/no adnexal mass. Left ovary Not visualized. Other findings No abnormal free fluid. IMPRESSION: 1. Focal endometrial  thickening in the lower uterine segment measuring up to 7 mm. This could reflect an endometrial polyp. Consider further evaluation with sonohysterogram for confirmation prior to hysteroscopy. Endometrial sampling should also be considered if patient is at high risk for endometrial carcinoma. (Ref: Radiological Reasoning: Algorithmic Workup of Abnormal Vaginal Bleeding with Endovaginal Sonography and Sonohysterography. AJR 2008; [id number]) 2. Nonvisualization of the left ovary. Electronically Signed   By: Obie Dredge M.D.   On: 03/30/2021 18:11    ____________________________________________    PROCEDURES  Procedure(s) performed:    Procedures    Medications - No data to display   ____________________________________________   INITIAL IMPRESSION / ASSESSMENT AND PLAN / ED COURSE  Pertinent labs & imaging results that were available during my care of the patient were reviewed by me and considered in my medical decision making (see chart for details).  Review of the [place] CSRS was performed in accordance of the [facility] prior to dispensing any controlled drugs.           Patient's diagnosis is consistent with atypical chest pain, abnormal uterine bleeding.  Patient presents emergency department multiple complaints.  She has been seen multiple times recently for chest pain as well as at her primary care.  Work-ups are reassuring, and I suspect a component of anxiety based off of my interaction with the patient.  This appears to be the consensus among the other providers that have seen the patient as well as her primary care.  However given the ongoing chest pain patient would like to see cardiology.  I will provide a referral for cardiology for her at this time.  Patient was also complaining of 8 months of vaginal bleeding.  Ultrasound revealed small area of focus in the endometrium which could be a polyp versus a more significant underlying abnormality.  At this time patient is to  follow-up with her OB/GYN.  She states that she is supposed to see her OB/GYN in 2 days..  Medications at this time.  Follow-up with cardiology, OB/GYN and primary care.  Patient is given ED precautions to return to the ED for any worsening or new symptoms.     ____________________________________________  FINAL CLINICAL IMPRESSION(S) / ED DIAGNOSES  Final diagnoses:  Abnormal uterine bleeding  Pelvic pain  Atypical chest pain      NEW MEDICATIONS STARTED DURING THIS VISIT:  ED Discharge Orders     None           This chart was dictated using voice recognition software/Dragon. Despite best efforts to proofread, errors can occur which can change the meaning. Any change was purely unintentional.  Racheal Patches, PA-C 03/30/21 1853    Merwyn Katos, MD 03/30/21 2156

## 2021-04-03 ENCOUNTER — Other Ambulatory Visit: Payer: Self-pay

## 2021-04-03 ENCOUNTER — Emergency Department
Admission: EM | Admit: 2021-04-03 | Discharge: 2021-04-03 | Disposition: A | Discharge status: Home / Self Care | Payer: Medicaid Other | Attending: Emergency Medicine | Admitting: Emergency Medicine

## 2021-04-03 ENCOUNTER — Emergency Department
Admission: EM | Admit: 2021-04-03 | Discharge: 2021-04-03 | Disposition: A | Discharge status: Home / Self Care | Payer: Medicaid Other | Source: Home / Self Care | Attending: Emergency Medicine | Admitting: Emergency Medicine

## 2021-04-03 ENCOUNTER — Encounter: Payer: Self-pay | Admitting: Emergency Medicine

## 2021-04-03 ENCOUNTER — Emergency Department: Payer: Medicaid Other

## 2021-04-03 DIAGNOSIS — F410 Panic disorder [episodic paroxysmal anxiety] without agoraphobia: Secondary | ICD-10-CM | POA: Insufficient documentation

## 2021-04-03 DIAGNOSIS — R45851 Suicidal ideations: Secondary | ICD-10-CM | POA: Diagnosis not present

## 2021-04-03 DIAGNOSIS — F32A Depression, unspecified: Secondary | ICD-10-CM

## 2021-04-03 DIAGNOSIS — R0789 Other chest pain: Secondary | ICD-10-CM | POA: Insufficient documentation

## 2021-04-03 DIAGNOSIS — R079 Chest pain, unspecified: Secondary | ICD-10-CM

## 2021-04-03 DIAGNOSIS — Y900 Blood alcohol level of less than 20 mg/100 ml: Secondary | ICD-10-CM | POA: Insufficient documentation

## 2021-04-03 DIAGNOSIS — F418 Other specified anxiety disorders: Secondary | ICD-10-CM | POA: Insufficient documentation

## 2021-04-03 DIAGNOSIS — F419 Anxiety disorder, unspecified: Secondary | ICD-10-CM

## 2021-04-03 HISTORY — DX: Chest pain, unspecified: R07.9

## 2021-04-03 LAB — CBC
HCT: 37.8 % (ref 36.0–46.0)
HCT: 39.2 % (ref 36.0–46.0)
Hemoglobin: 12.9 g/dL (ref 12.0–15.0)
Hemoglobin: 13.4 g/dL (ref 12.0–15.0)
MCH: 29.1 pg (ref 26.0–34.0)
MCH: 29.1 pg (ref 26.0–34.0)
MCHC: 34.1 g/dL (ref 30.0–36.0)
MCHC: 34.2 g/dL (ref 30.0–36.0)
MCV: 85.1 fL (ref 80.0–100.0)
MCV: 85.2 fL (ref 80.0–100.0)
Platelets: 313 10*3/uL (ref 150–400)
Platelets: 320 10*3/uL (ref 150–400)
RBC: 4.44 MIL/uL (ref 3.87–5.11)
RBC: 4.6 MIL/uL (ref 3.87–5.11)
RDW: 13.5 % (ref 11.5–15.5)
RDW: 13.6 % (ref 11.5–15.5)
WBC: 11.1 10*3/uL — ABNORMAL HIGH (ref 4.0–10.5)
WBC: 9.8 10*3/uL (ref 4.0–10.5)
nRBC: 0 % (ref 0.0–0.2)
nRBC: 0 % (ref 0.0–0.2)

## 2021-04-03 LAB — BASIC METABOLIC PANEL
Anion gap: 7 (ref 5–15)
BUN: 8 mg/dL (ref 6–20)
CO2: 23 mmol/L (ref 22–32)
Calcium: 9.7 mg/dL (ref 8.9–10.3)
Chloride: 106 mmol/L (ref 98–111)
Creatinine, Ser: 0.61 mg/dL (ref 0.44–1.00)
GFR, Estimated: >60 mL/min (ref >60)
Glucose, Bld: 109 mg/dL — ABNORMAL HIGH (ref 70–99)
Potassium: 3.7 mmol/L (ref 3.5–5.1)
Sodium: 136 mmol/L (ref 135–145)

## 2021-04-03 LAB — ETHANOL: Alcohol, Ethyl (B): <10 mg/dL (ref <10)

## 2021-04-03 LAB — COMPREHENSIVE METABOLIC PANEL
ALT: 22 U/L (ref 0–44)
AST: 20 U/L (ref 15–41)
Albumin: 4.2 g/dL (ref 3.5–5.0)
Alkaline Phosphatase: 72 U/L (ref 38–126)
Anion gap: 10 (ref 5–15)
BUN: 12 mg/dL (ref 6–20)
CO2: 24 mmol/L (ref 22–32)
Calcium: 9.4 mg/dL (ref 8.9–10.3)
Chloride: 106 mmol/L (ref 98–111)
Creatinine, Ser: 0.65 mg/dL (ref 0.44–1.00)
GFR, Estimated: >60 mL/min (ref >60)
Glucose, Bld: 133 mg/dL — ABNORMAL HIGH (ref 70–99)
Potassium: 3.8 mmol/L (ref 3.5–5.1)
Sodium: 140 mmol/L (ref 135–145)
Total Bilirubin: 0.5 mg/dL (ref 0.3–1.2)
Total Protein: 7.3 g/dL (ref 6.5–8.1)

## 2021-04-03 LAB — ACETAMINOPHEN LEVEL: Acetaminophen (Tylenol), Serum: <10 ug/mL — ABNORMAL LOW (ref 10–30)

## 2021-04-03 LAB — TROPONIN I (HIGH SENSITIVITY)
Troponin I (High Sensitivity): 4 ng/L (ref <18)
Troponin I (High Sensitivity): 4 ng/L (ref <18)

## 2021-04-03 LAB — SALICYLATE LEVEL: Salicylate Lvl: <7 mg/dL — ABNORMAL LOW (ref 7.0–30.0)

## 2021-04-03 MED ORDER — IBUPROFEN 600 MG PO TABS
600.0000 mg | ORAL_TABLET | Freq: Once | ORAL | Status: AC
  Administered 2021-04-03: 600 mg via ORAL
  Filled 2021-04-03: qty 1

## 2021-04-03 NOTE — ED Notes (Signed)
Pt left via wheelchair with belongings. Discharge instructions reviewed; pt verbalized understanding. Pt c/o chest pain which she rated 8/10 on 0-10 pain scale at discharge. Pt verbalized concern about going home and said that she did not want to leave the hospital. Pt states "My parents are going to be mad at me for the way I feel." Pt encouraged to follow discharge instructions and follow up with [facility]. No acute distress noted at discharge.

## 2021-04-03 NOTE — ED Triage Notes (Signed)
Patient BIB EMS for palpitations, has been seen multiple times for same complaint and diagnosed with anxiety, most recent full workup done on 04/01/21. Patient also states she stopped taking her BP medicine because she thought it was "making her feel funny". When asked if patient followed up with PCP after most recent ER visit, patient states she could not because her mom would not take her to appointment because "nothing is wrong with her." Patient presents in NAD, AxOx4.

## 2021-04-03 NOTE — ED Notes (Signed)
Items taken from patient and placed in belongings bag: Light blue shoes Grey socks Grey pajama pants White shirt Cell phone 5 white rings 1 yellow ring

## 2021-04-03 NOTE — ED Notes (Signed)
This RN gives patient ibuprofen & water. Pt spits ibuprofen on floor. When asked why she states that she wanted to take it with her mountain dew instead.

## 2021-04-03 NOTE — ED Notes (Signed)
NP at bedside.

## 2021-04-03 NOTE — ED Notes (Signed)
Pt sitting in recliner in hallway; calm, cooperative. Pt states "I just want to stay her for a while and feel safe. I feel like I'm going to die if I go to sleep." Pt c/o L side chest pain, which she rates 7/10 on 0-10 pain scale and describes as "sharp, aching"; pt states that pain has gotten better. Pt denies SI/HI/AVH at this time. Pt reports depression and anxiety due to her fear of dying if she goes to sleep. No acute distress noted.

## 2021-04-03 NOTE — Consult Note (Addendum)
[facility] Face-to-Face Psychiatry Consult   Reason for Consult:  Psychiatric consult Referring Physician:  Dr. Derrill Kay Patient Identification: Robin Hughes MRN:  [id number] Principal Diagnosis: Chest pain, unspecified Diagnosis:  Principal Problem:   Chest pain, unspecified   Total Time spent with patient: 45 minutes  Subjective:   Robin Hughes is a 35 y.o. female patient admitted with unspecified chest pain.  HPI:  Robin Hughes, 35 y.o., female patient presented to [facility]  Patient seen by TTS and this provider; chart reviewed and consulted with Dr. Derrill Kay on 04/03/21.  On evaluation Robin Hughes reports that she is continuing to have chest pain.  She reports her pain as radiating with numbness.  This is her 4th presentation this week to [facility] with the same complaint.  Standard xrays, troponin levels, and EKG's have all been normal. It was recommended that she follow up with a cardiologist. Patient states that she missed her appt on Friday due to know one believing that she is having legitimate cardiac issues.    Spoke to her mother who states that she feels her daughter needs therapy.  She has agreed to take her daughter to her cardiology appt.  Mom states that her daughter has been having what she perceives as anxiety attacks for the past month.  She is unable to identify possible triggers.   Patient denies SI/HI/AVH.  At this time the patient does not meet criteria for psychiatric inpatient hospitalization. She  Past Psychiatric History: Depression and Anxiety  Risk to Self:  no Risk to Others:  no Prior Inpatient Therapy:  no Prior Outpatient Therapy:  no  Past Medical History:  Past Medical History:  Diagnosis Date   CP (cerebral palsy) (HCC)     Past Surgical History:  Procedure Laterality Date   BACK SURGERY     Family History: History reviewed. No pertinent family history. Family Psychiatric  History: unknown Social History:  Social History   Substance  and Sexual Activity  Alcohol Use No     Social History   Substance and Sexual Activity  Drug Use Never    Social History   Socioeconomic History   Marital status: Single    Spouse name: Not on file   Number of children: Not on file   Years of education: Not on file   Highest education level: Not on file  Occupational History   Not on file  Tobacco Use   Smoking status: Never   Smokeless tobacco: Never  Vaping Use   Vaping Use: Never used  Substance and Sexual Activity   Alcohol use: No   Drug use: Never   Sexual activity: Not Currently    Birth control/protection: None  Other Topics Concern   Not on file  Social History Narrative   Not on file   Social Determinants of Health   Financial Resource Strain: Not on file  Food Insecurity: Not on file  Transportation Needs: Not on file  Physical Activity: Not on file  Stress: Not on file  Social Connections: Not on file   Additional Social History:    Allergies:  No Known Allergies  Labs:  Results for orders placed or performed during the hospital encounter of 04/03/21 (from the past 48 hour(s))  Basic metabolic panel     Status: Abnormal   Collection Time: 04/03/21  4:51 PM  Result Value Ref Range   Sodium 136 135 - 145 mmol/L   Potassium 3.7 3.5 - 5.1 mmol/L   Chloride 106  98 - 111 mmol/L   CO2 23 22 - 32 mmol/L   Glucose, Bld 109 (H) 70 - 99 mg/dL    Comment: Glucose reference range applies only to samples taken after fasting for at least 8 hours.   BUN 8 6 - 20 mg/dL   Creatinine, Ser 3.35 0.44 - 1.00 mg/dL   Calcium 9.7 8.9 - 45.6 mg/dL   GFR, Estimated >25 >63 mL/min    Comment: (NOTE) Calculated using the CKD-EPI Creatinine Equation (2021)    Anion gap 7 5 - 15    Comment: Performed at [facility], [address] [place], [place] [address]  CBC     Status: None   Collection Time: 04/03/21  4:51 PM  Result Value Ref Range   WBC 9.8 4.0 - 10.5 K/uL   RBC 4.60 3.87 - 5.11 MIL/uL    Hemoglobin 13.4 12.0 - 15.0 g/dL   HCT 42.8 76.8 - 11.5 %   MCV 85.2 80.0 - 100.0 fL   MCH 29.1 26.0 - 34.0 pg   MCHC 34.2 30.0 - 36.0 g/dL   RDW 72.6 20.3 - 55.9 %   Platelets 320 150 - 400 K/uL   nRBC 0.0 0.0 - 0.2 %    Comment: Performed at [facility], [address], [place], [place] [address]  Troponin I (High Sensitivity)     Status: None   Collection Time: 04/03/21  4:51 PM  Result Value Ref Range   Troponin I (High Sensitivity) 4 <18 ng/L    Comment: (NOTE) Elevated high sensitivity troponin I (hsTnI) values and significant  changes across serial measurements may suggest ACS but many other  chronic and acute conditions are known to elevate hsTnI results.  Refer to the "Links" section for chest pain algorithms and additional  guidance. Performed at [facility], [address] [place], [place] [address]     No current facility-administered medications for this encounter.   Current Outpatient Medications  Medication Sig Dispense Refill   gabapentin (NEURONTIN) 100 MG capsule Take 100 mg by mouth at bedtime.     hydrOXYzine (ATARAX/VISTARIL) 10 MG tablet Take 1 tablet (10 mg total) by mouth 3 (three) times daily as needed for anxiety. 30 tablet 0   oxyCODONE-acetaminophen (PERCOCET) 7.5-325 MG tablet Take 1 tablet by mouth every 6 (six) hours as needed. 20 tablet 0   sertraline (ZOLOFT) 25 MG tablet Take 25 mg by mouth daily.     sulfamethoxazole-trimethoprim (BACTRIM DS) 800-160 MG tablet Take 1 tablet by mouth 2 (two) times daily. 20 tablet 0    Musculoskeletal: Strength & Muscle Tone: within normal limits Gait & Station:  CP Patient leans: N/A            Psychiatric Specialty Exam:  Presentation  General Appearance:  Appropriate for Environment Eye Contact: Good Speech: Clear and Coherent Speech Volume: Normal Handedness: Right  Mood and Affect  Mood: Anxious Affect: Congruent  Thought Process  Thought  Processes: Coherent Descriptions of Associations:Intact Orientation:Full (Time, Place and Person) Thought Content:Rumination History of Schizophrenia/Schizoaffective disorder:No data recorded Duration of Psychotic Symptoms:No data recorded Hallucinations:Hallucinations: None Ideas of Reference:None Suicidal Thoughts:Suicidal Thoughts: No Homicidal Thoughts:Homicidal Thoughts: No  Sensorium  Memory: Immediate Fair; Recent Fair Judgment: Fair Insight: Fair  Art therapist  Concentration: Fair Attention Span: Fair Recall: [employer] of Knowledge: Fair Language: Fair  Psychomotor Activity  Psychomotor Activity: Psychomotor Activity: Normal  Assets  Assets: Communication Skills; Desire for Improvement; Financial Resources/Insurance; Housing; Social Support  Sleep  Sleep: Sleep: Good  Physical Exam: Physical Exam Vitals and nursing note reviewed.  HENT:     Head: Normocephalic and atraumatic.  Eyes:     Pupils: Pupils are equal, round, and reactive to light.  Cardiovascular:     Rate and Rhythm: Normal rate.  Pulmonary:     Effort: Pulmonary effort is normal.  Musculoskeletal:        General: Normal range of motion.     Cervical back: Normal range of motion.  Skin:    General: Skin is warm and dry.  Neurological:     General: No focal deficit present.     Mental Status: She is alert.  Psychiatric:        Attention and Perception: Attention and perception normal.        Mood and Affect: Affect normal. Mood is anxious.        Speech: Speech normal.        Behavior: Behavior normal. Behavior is cooperative.        Thought Content: Thought content normal.        Cognition and Memory: Cognition and memory normal.   Review of Systems  Psychiatric/Behavioral:  Negative for substance abuse and suicidal ideas. The patient is nervous/anxious.   All other systems reviewed and are negative. Blood pressure 117/89, pulse 82, temperature 98.3 F (36.8 C),  temperature source Oral, resp. rate 16, height 4\' 11"  (1.499 m), weight 59 kg, last menstrual period 03/27/2021, SpO2 98 %, unknown if currently breastfeeding. Body mass index is 26.26 kg/m.   Disposition: No evidence of imminent risk to self or others at present.   Patient does not meet criteria for psychiatric inpatient admission. Supportive therapy provided about ongoing stressors. Patient given information to follow-up with therapy and a cardiologist.  05/27/2021, NP 04/03/2021 11:54 PM

## 2021-04-03 NOTE — Discharge Instructions (Addendum)
You have been seen in the Emergency Department (ED) today for chest pain.  As we have discussed today?s test results are normal, and we feel it is likely that panic attacks may be causing your symptoms. ° °Please follow up with the recommended doctor as instructed above in these documents regarding today?s emergent visit and your recent symptoms to discuss further management.  Continue to take your regular medications. If you are not doing so already, consider taking a daily baby aspirin (81 mg), at least until you follow up with your doctor. ° °Return to the Emergency Department (ED) if you experience any further chest pain/pressure/tightness, difficulty breathing, or sudden sweating, or other symptoms that concern you. °

## 2021-04-03 NOTE — ED Notes (Signed)
Per MD - 2nd Troponin not needed at this time.

## 2021-04-03 NOTE — ED Provider Notes (Signed)
[facility] Emergency Department Provider Note  ____________________________________________   I have reviewed the triage vital signs and the nursing notes.   HISTORY  Chief Complaint Mental Health concern   History limited by: Not Limited   HPI Robin Hughes is a 35 y.o. female who presents to the emergency department today because she states she wants help with her mental health.  Patient states that she would be willing to go to an inpatient psychiatric facility.  Patient states that she has been having issues with panic attacks. Patient states she is still having the chest pain for which she has been recently evaluated in the ED multiple times.   Records reviewed. Per medical record review patient has a history of CP. Multiple ED visits recently for chest pain/anxiety. Was seen last night.   Past Medical History:  Diagnosis Date   CP (cerebral palsy) (HCC)     Patient Active Problem List   Diagnosis Date Noted   Indication for care in labor and delivery, antepartum 05/14/2019   Cerebral palsy (HCC) 11/27/2015   Restless leg syndrome 11/27/2015    Past Surgical History:  Procedure Laterality Date   BACK SURGERY      Prior to Admission medications   Medication Sig Start Date End Date Taking? Authorizing Provider  gabapentin (NEURONTIN) 100 MG capsule Take 100 mg by mouth at bedtime.    [provider]  hydrOXYzine (ATARAX/VISTARIL) 10 MG tablet Take 1 tablet (10 mg total) by mouth 3 (three) times daily as needed for anxiety. 03/22/21   Phineas Semen, MD  oxyCODONE-acetaminophen (PERCOCET) 7.5-325 MG tablet Take 1 tablet by mouth every 6 (six) hours as needed. 10/28/19   Joni Reining, PA-C  sertraline (ZOLOFT) 25 MG tablet Take 25 mg by mouth daily.    [provider]  sulfamethoxazole-trimethoprim (BACTRIM DS) 800-160 MG tablet Take 1 tablet by mouth 2 (two) times daily. 10/28/19   Joni Reining, PA-C     Allergies Patient has no known allergies.  History reviewed. No pertinent family history.  Social History Social History   Tobacco Use   Smoking status: Never   Smokeless tobacco: Never  Vaping Use   Vaping Use: Never used  Substance Use Topics   Alcohol use: No   Drug use: Never    Review of Systems Constitutional: No fever/chills Eyes: No visual changes. ENT: No sore throat. Cardiovascular: Positive for chest pain. Respiratory: Denies shortness of breath. Gastrointestinal: No abdominal pain.  No nausea, no vomiting.  No diarrhea.   Genitourinary: Negative for dysuria. Musculoskeletal: Negative for back pain. Skin: Negative for rash. Neurological: Negative for headaches, focal weakness or numbness.  ____________________________________________   PHYSICAL EXAM:  VITAL SIGNS: ED Triage Vitals  Enc Vitals Group     BP 04/03/21 1648 (!) 151/107     Pulse Rate 04/03/21 1648 74     Resp 04/03/21 1648 18     Temp 04/03/21 1648 98.3 F (36.8 C)     Temp Source 04/03/21 1648 Oral     SpO2 04/03/21 1648 97 %     Weight 04/03/21 1649 130 lb (59 kg)     Height 04/03/21 1649 4\' 11"  (1.499 m)     Head Circumference --      Peak Flow --      Pain Score 04/03/21 1649 9   Constitutional: Alert and oriented.  Eyes: Conjunctivae are normal.  ENT      Head: Normocephalic and atraumatic.  Nose: No congestion/rhinnorhea.      Mouth/Throat: Mucous membranes are moist.      Neck: No stridor. Hematological/Lymphatic/Immunilogical: No cervical lymphadenopathy. Cardiovascular: Normal rate, regular rhythm.  No murmurs, rubs, or gallops.  Respiratory: Normal respiratory effort without tachypnea nor retractions. Breath sounds are clear and equal bilaterally. No wheezes/rales/rhonchi. Gastrointestinal: Soft and non tender. No rebound. No guarding.  Genitourinary: Deferred Musculoskeletal: Normal range of motion in all extremities. No lower extremity edema. Neurologic:   Normal speech and language. No gross focal neurologic deficits are appreciated.  Skin:  Skin is warm, dry and intact. No rash noted. Psychiatric: Anxious. Tearful.   ____________________________________________    LABS (pertinent positives/negatives)  Trop hs 4 CBC wbc 9.8, hgb 13.4, plt 320 BMP wnl except glu 109  ____________________________________________   EKG  I, Phineas Semen, attending physician, personally viewed and interpreted this EKG  EKG Time: 1645 Rate: 68 Rhythm: normal sinus rhythm Axis: normal Intervals: qtc 421 QRS: incomplete RBBB ST changes: no st elevation Impression: abnormal ekg   ____________________________________________    RADIOLOGY  CXR No acute abnormality ____________________________________________   PROCEDURES  Procedures  ____________________________________________   INITIAL IMPRESSION / ASSESSMENT AND PLAN / ED COURSE  Pertinent labs & imaging results that were available during my care of the patient were reviewed by me and considered in my medical decision making (see chart for details).   Patient presents to the emergency department today because of concerns for mental illness and chest pain.  This is now 1 of many recent ED visits for the same symptoms.  On exam she does appear quite anxious.  I did have a good discussion with the patient about work-up today again showing no signs of any cardiac damage.  Did have psychiatry evaluate the patient.  This time they do not feel she requires inpatient admission.  Although they did discuss some of the concerns with the patient.  Discussed with mother to help encourage patient to follow-up with cardiology.  ____________________________________________   FINAL CLINICAL IMPRESSION(S) / ED DIAGNOSES  Final diagnoses:  Atypical chest pain  Anxiety     Note: This dictation was prepared with Dragon dictation. Any transcriptional errors that result from this process are  unintentional     Phineas Semen, MD 04/04/21 [phone]

## 2021-04-03 NOTE — Discharge Instructions (Addendum)
Please seek medical attention for any high fevers, chest pain, shortness of breath, change in behavior, persistent vomiting, bloody stool or any other new or concerning symptoms.  

## 2021-04-03 NOTE — ED Provider Notes (Signed)
[facility] Emergency Department Provider Note  ____________________________________________   Event Date/Time   First MD Initiated Contact with Patient 04/03/21 0210     (approximate)  I have reviewed the triage vital signs and the nursing notes.   HISTORY  Chief Complaint Suicidal    HPI Robin Hughes is a 35 y.o. female with medical history as listed below who presents for evaluation of chest pain as well as frustration over the persistent symptoms.  This is her fifth emergency department visit within the last 6 months but this is her fourth visit within about 2 weeks.  She is very concerned that something is wrong with her heart.  She has episodes of generalized chest pain that is sharp and associated with shortness of breath.  She has had extensive medical work-ups that have been negative.  She was referred to cardiology but she states that her mom refused to take her because her mother says "there is nothing wrong".  She says it is not her anxiety that is causing her symptoms, but she repeatedly asked "am I going to die".  She said that she is dealing with a lot of issues that make her very depressed and worried.  She claims that she is not having any thoughts of killing herself, she just wants to feel better and feel "like myself again".  She said that she is a single mother with 3 children and is doing her best but it is a lot to manage.  She describes the symptoms as severe, intermittent, nothing in particular makes them better or worse.     Past Medical History:  Diagnosis Date   CP (cerebral palsy) (HCC)     Patient Active Problem List   Diagnosis Date Noted   Indication for care in labor and delivery, antepartum 05/14/2019   Cerebral palsy (HCC) 11/27/2015   Restless leg syndrome 11/27/2015    Past Surgical History:  Procedure Laterality Date   BACK SURGERY      Prior to Admission medications   Medication Sig Start Date End Date  Taking? Authorizing Provider  gabapentin (NEURONTIN) 100 MG capsule Take 100 mg by mouth at bedtime.    [provider]  hydrOXYzine (ATARAX/VISTARIL) 10 MG tablet Take 1 tablet (10 mg total) by mouth 3 (three) times daily as needed for anxiety. 03/22/21   Phineas Semen, MD  oxyCODONE-acetaminophen (PERCOCET) 7.5-325 MG tablet Take 1 tablet by mouth every 6 (six) hours as needed. 10/28/19   Joni Reining, PA-C  sertraline (ZOLOFT) 25 MG tablet Take 25 mg by mouth daily.    [provider]  sulfamethoxazole-trimethoprim (BACTRIM DS) 800-160 MG tablet Take 1 tablet by mouth 2 (two) times daily. 10/28/19   Joni Reining, PA-C    Allergies Patient has no known allergies.  No family history on file.  Social History Social History   Tobacco Use   Smoking status: Never   Smokeless tobacco: Never  Vaping Use   Vaping Use: Never used  Substance Use Topics   Alcohol use: No   Drug use: Never    Review of Systems Constitutional: No fever/chills Eyes: No visual changes. ENT: No sore throat. Cardiovascular: Episodic chest pain for weeks. Respiratory: Episodic shortness of breath for weeks. Gastrointestinal: No abdominal pain.  No nausea, no vomiting.  No diarrhea.  No constipation. Genitourinary: Negative for dysuria. Musculoskeletal: Negative for neck pain.  Negative for back pain. Integumentary: Negative for rash. Neurological: Negative for headaches, focal weakness or  numbness.   ____________________________________________   PHYSICAL EXAM:  VITAL SIGNS: ED Triage Vitals [04/03/21 0059]  Enc Vitals Group     BP (!) 142/102     Pulse Rate 68     Resp 16     Temp 98 F (36.7 C)     Temp Source Oral     SpO2 98 %     Weight      Height      Head Circumference      Peak Flow      Pain Score      Pain Loc      Pain Edu?      Excl. in GC?     Constitutional: Alert and oriented.  Eyes: Conjunctivae are normal.  Chronic Congruent eye gaze apparently  due to cervical palsy. Head: Atraumatic. Nose: No congestion/rhinnorhea. Mouth/Throat: Patient is wearing a mask. Neck: No stridor.  No meningeal signs.   Cardiovascular: Normal rate, regular rhythm. Good peripheral circulation. Respiratory: Normal respiratory effort.  No retractions. Gastrointestinal: Soft and nontender. No distention.  Musculoskeletal: No lower extremity tenderness nor edema. No gross deformities of extremities. Neurologic: Slightly slurred speech with decreased function of her arms which is at baseline as a result of her cerebral palsy.  No acute findings. Skin:  Skin is warm, dry and intact. Psychiatric: Mood and affect are somewhat depressed and anxious.  She is very focused on her belief that she is having a severe medical issue.  She is somewhat reluctant to believe that she is suffering from anxiety or panic attacks.  ____________________________________________   LABS (all labs ordered are listed, but only abnormal results are displayed)  Labs Reviewed  COMPREHENSIVE METABOLIC PANEL - Abnormal; Notable for the following components:      Result Value   Glucose, Bld 133 (*)    All other components within normal limits  SALICYLATE LEVEL - Abnormal; Notable for the following components:   Salicylate Lvl <7.0 (*)    All other components within normal limits  ACETAMINOPHEN LEVEL - Abnormal; Notable for the following components:   Acetaminophen (Tylenol), Serum <10 (*)    All other components within normal limits  CBC - Abnormal; Notable for the following components:   WBC 11.1 (*)    All other components within normal limits  ETHANOL  URINE DRUG SCREEN, QUALITATIVE ([facility] ONLY)  POC URINE PREG, ED  TROPONIN I (HIGH SENSITIVITY)   ____________________________________________  EKG  ED ECG REPORT I, Loleta Rose, the attending physician, personally viewed and interpreted this ECG.  Date: 04/03/2021 EKG Time: 1:07 AM Rate: 67 Rhythm: normal sinus  rhythm QRS Axis: normal Intervals: normal ST/T Wave abnormalities: normal Narrative Interpretation: no evidence of acute ischemia  ____________________________________________   INITIAL IMPRESSION / MDM / ASSESSMENT AND PLAN / ED COURSE  As part of my medical decision making, I reviewed the following data within the electronic MEDICAL RECORD NUMBER Nursing notes reviewed and incorporated, Labs reviewed , EKG interpreted , Old chart reviewed, and Notes from prior ED visits   Differential diagnosis includes, but is not limited to, anxiety, panic attacks, musculoskeletal pain, ACS, PE.  I reviewed the medical record and she has had extensive work-ups in the recent past including reassuring lab work, troponins, EKG, D-dimer, etc.  She is at low risk for ACS and PE and again has had a normal D-dimer.  Tonight she has essentially normal vital signs other than than some hypertension.  EKG is nonischemic.  Normal high-sensitivity troponin, normal comprehensive  metabolic panel, normal CBC other than a very mildly elevated white blood cell count.  The patient will speak with psychiatry but at this point I think that the patient does not meet IVC nor inpatient psychiatric treatment criteria.  Additionally there is no evidence that she has an emergent medical condition.  Together with her nurse, I talked to her very frankly about the fact that I believe that she is suffering from anxiety and panic attacks which could absolutely cause the type of symptoms and feelings that she is experiencing.  She has not been taking the blood pressure medicine that she was prescribed and she just recently started on Zoloft from her primary care provider.  I encouraged her to continue taking the Zoloft and to take the blood pressure medicine as prescribed.  She should also follow-up at [facility].  She was willing to consider these recommendations and understands that there is no reason for her to stay in the hospital at this time.       Clinical Course as of 04/03/21 0445  Sat Apr 03, 2021  0228 Discussed case in person with Rashaun from psychiatry.  They do not believe that the patient meets involuntary commitment criteria nor inpatient psychiatric treatment.  The patient is not endorsing any suicidal ideation and adamantly insists that this is not her anxiety and fears that she is having a heart attack.  However they do not think that there is a need for inpatient psychiatric treatment at this time. [CF]    Clinical Course User Index [CF] Loleta Rose, MD     ____________________________________________  FINAL CLINICAL IMPRESSION(S) / ED DIAGNOSES  Final diagnoses:  Depression, unspecified depression type  Anxiety  Panic attacks  Atypical chest pain     MEDICATIONS GIVEN DURING THIS VISIT:  Medications - No data to display   ED Discharge Orders     None        Note:  This document was prepared using Dragon voice recognition software and may include unintentional dictation errors.   Loleta Rose, MD 04/03/21 [phone]

## 2021-04-03 NOTE — ED Notes (Signed)
Patient given graham crackers by request. Robin Hughes near toilet per request. Pt declines moving it back to position.

## 2021-04-03 NOTE — ED Notes (Signed)
Upon completion of triage, patient states she "no longer wants to be alive because she is a bother to everyone" When asked if she has a plan, patient denies any plan to hurt herself Patient changed into purple scrubs, belongings placed into patient belonging bags Acuity level changed to reflect new complaint

## 2021-04-03 NOTE — ED Notes (Addendum)
Pt via EMS with complaints of palpitations, was seen in [facility] for same symptoms last night  BP 150/100 EKG hr 80

## 2021-04-03 NOTE — ED Notes (Signed)
This RN responds to call light. Pt requesting ibuprofen for HA. MD notified via secure chat.

## 2021-04-03 NOTE — ED Triage Notes (Signed)
Pt via POV from home. Pt c/o centralized to L sided CP that radiates to her back and palpitations. Pt states that it has been going on for about a month. Pt states that when she has these palpitations her arms go numb and she starts to shake. Pt also endorses facial numbness intermittently.   Denies NVD. Denies SOB. Pt is A&Ox4 and NAD

## 2021-04-20 ENCOUNTER — Telehealth: Payer: Self-pay | Admitting: Licensed Clinical Social Worker

## 2021-04-20 NOTE — Telephone Encounter (Signed)
04/19/2021 - Patient left vm inquiring about counseling services.

## 2021-04-24 ENCOUNTER — Other Ambulatory Visit: Payer: Self-pay

## 2021-04-24 ENCOUNTER — Emergency Department: Payer: Medicaid Other

## 2021-04-24 ENCOUNTER — Encounter: Payer: Self-pay | Admitting: Radiology

## 2021-04-24 DIAGNOSIS — M546 Pain in thoracic spine: Secondary | ICD-10-CM | POA: Diagnosis not present

## 2021-04-24 DIAGNOSIS — N9489 Other specified conditions associated with female genital organs and menstrual cycle: Secondary | ICD-10-CM | POA: Diagnosis not present

## 2021-04-24 DIAGNOSIS — M4316 Spondylolisthesis, lumbar region: Secondary | ICD-10-CM | POA: Insufficient documentation

## 2021-04-24 DIAGNOSIS — F419 Anxiety disorder, unspecified: Secondary | ICD-10-CM | POA: Diagnosis not present

## 2021-04-24 DIAGNOSIS — N200 Calculus of kidney: Secondary | ICD-10-CM | POA: Insufficient documentation

## 2021-04-24 DIAGNOSIS — R0789 Other chest pain: Secondary | ICD-10-CM | POA: Insufficient documentation

## 2021-04-24 LAB — BASIC METABOLIC PANEL
Anion gap: 8 (ref 5–15)
BUN: 10 mg/dL (ref 6–20)
CO2: 22 mmol/L (ref 22–32)
Calcium: 9.6 mg/dL (ref 8.9–10.3)
Chloride: 107 mmol/L (ref 98–111)
Creatinine, Ser: 0.57 mg/dL (ref 0.44–1.00)
GFR, Estimated: >60 mL/min (ref >60)
Glucose, Bld: 120 mg/dL — ABNORMAL HIGH (ref 70–99)
Potassium: 3.9 mmol/L (ref 3.5–5.1)
Sodium: 137 mmol/L (ref 135–145)

## 2021-04-24 LAB — CBC
HCT: 38.1 % (ref 36.0–46.0)
Hemoglobin: 12.9 g/dL (ref 12.0–15.0)
MCH: 28.9 pg (ref 26.0–34.0)
MCHC: 33.9 g/dL (ref 30.0–36.0)
MCV: 85.2 fL (ref 80.0–100.0)
Platelets: 289 10*3/uL (ref 150–400)
RBC: 4.47 MIL/uL (ref 3.87–5.11)
RDW: 13.2 % (ref 11.5–15.5)
WBC: 12.4 10*3/uL — ABNORMAL HIGH (ref 4.0–10.5)
nRBC: 0 % (ref 0.0–0.2)

## 2021-04-24 LAB — TROPONIN I (HIGH SENSITIVITY): Troponin I (High Sensitivity): 3 ng/L (ref <18)

## 2021-04-24 NOTE — ED Triage Notes (Signed)
C/o recurrent midsternal CP and upper back pain x 2 months. Pt. States new symptom is that "my heart rate drops to the 30's or 50's when I'm sleeping." Pt. States she has been diagnosed with anxiety. Pt. States she has seen cardiology and PCP for same

## 2021-04-25 ENCOUNTER — Encounter: Payer: Self-pay | Admitting: Radiology

## 2021-04-25 ENCOUNTER — Emergency Department: Payer: Medicaid Other

## 2021-04-25 ENCOUNTER — Emergency Department
Admission: EM | Admit: 2021-04-25 | Discharge: 2021-04-25 | Disposition: A | Discharge status: Home / Self Care | Payer: Medicaid Other | Attending: Student in an Organized Health Care Education/Training Program | Admitting: Student in an Organized Health Care Education/Training Program

## 2021-04-25 DIAGNOSIS — R1084 Generalized abdominal pain: Secondary | ICD-10-CM

## 2021-04-25 DIAGNOSIS — R0789 Other chest pain: Secondary | ICD-10-CM

## 2021-04-25 LAB — URINALYSIS, ROUTINE W REFLEX MICROSCOPIC
Bilirubin Urine: NEGATIVE
Glucose, UA: NEGATIVE mg/dL
Hgb urine dipstick: NEGATIVE
Ketones, ur: 20 mg/dL — AB
Leukocytes,Ua: NEGATIVE
Nitrite: NEGATIVE
Protein, ur: NEGATIVE mg/dL
Specific Gravity, Urine: 1.035 — ABNORMAL HIGH (ref 1.005–1.030)
pH: 7 (ref 5.0–8.0)

## 2021-04-25 LAB — HEPATIC FUNCTION PANEL
ALT: 20 U/L (ref 0–44)
AST: 21 U/L (ref 15–41)
Albumin: 4 g/dL (ref 3.5–5.0)
Alkaline Phosphatase: 68 U/L (ref 38–126)
Bilirubin, Direct: <0.1 mg/dL (ref 0.0–0.2)
Total Bilirubin: 0.3 mg/dL (ref 0.3–1.2)
Total Protein: 7.5 g/dL (ref 6.5–8.1)

## 2021-04-25 LAB — HCG, QUANTITATIVE, PREGNANCY: hCG, Beta Chain, Quant, S: <1 m[IU]/mL (ref <5)

## 2021-04-25 LAB — POC URINE PREG, ED: Preg Test, Ur: NEGATIVE

## 2021-04-25 LAB — LIPASE, BLOOD: Lipase: 55 U/L — ABNORMAL HIGH (ref 11–51)

## 2021-04-25 IMAGING — CT CT ABD-PELV W/ CM
2 of 4 series · 16 of 46 positions shown, 18 images · IV contrast (APPLIED)
Comparison: None.

CLINICAL DATA: 35-year-old female with abdominal and pelvic pain.

EXAM:
CT ABDOMEN AND PELVIS WITH CONTRAST
TECHNIQUE: Multidetector CT imaging of the abdomen and pelvis was performed
using the standard protocol following bolus administration of
intravenous contrast.
CONTRAST:  100mL OMNIPAQUE IOHEXOL 300 MG/ML  SOLN

[Series 2: routine abd/pel with · axial · 0.73mm/px · z∈[-463,-68]mm · 13 of 87 slices shown, 15 images]
[im 4/87  soft-tissue]
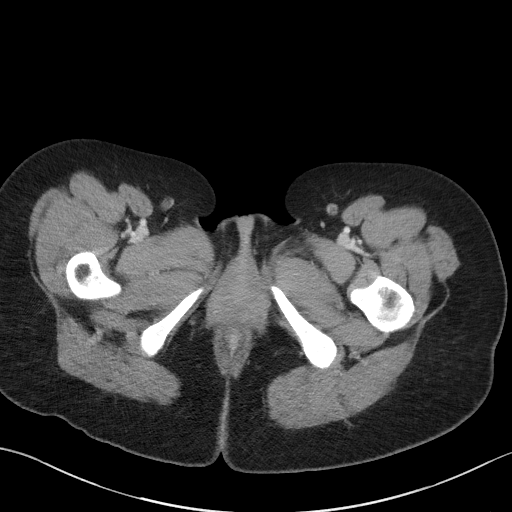
[im 4/87  bone]
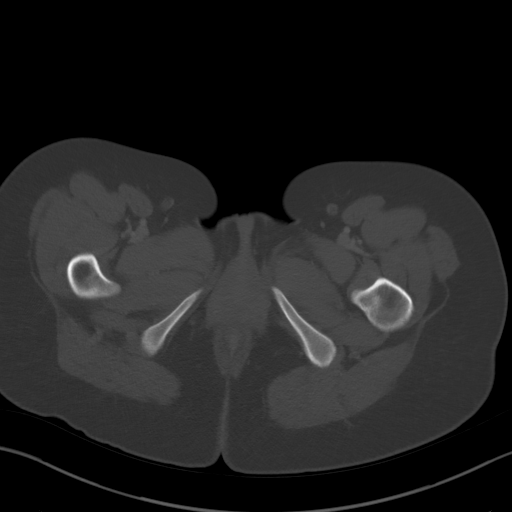
[im 12/87  soft-tissue]
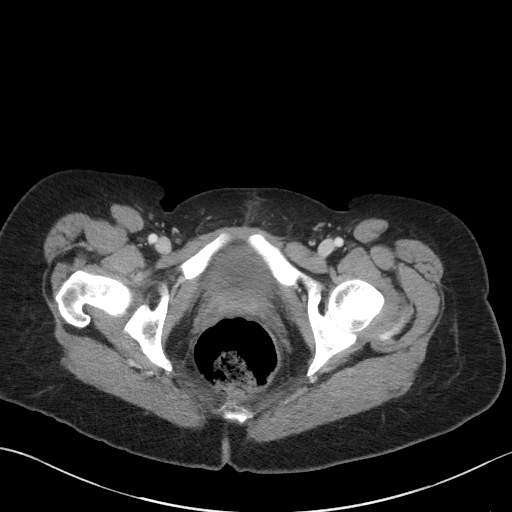
[im 19/87  soft-tissue]
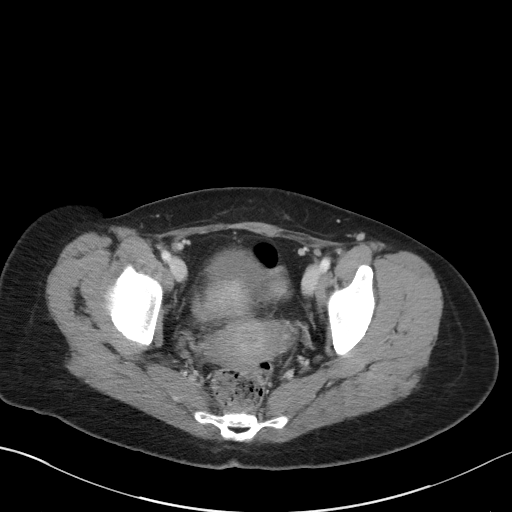
[im 23/87  soft-tissue]
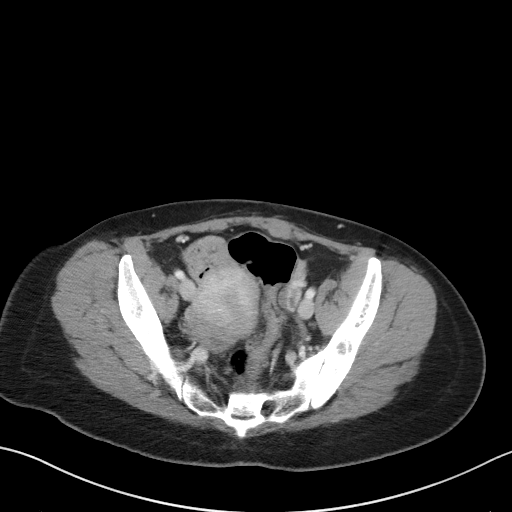
[im 30/87  soft-tissue]
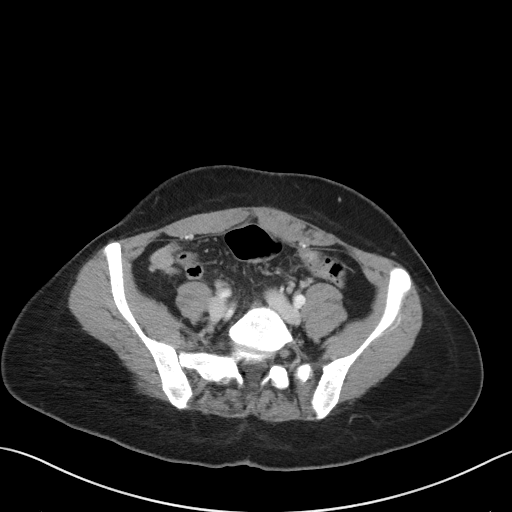
[im 38/87  soft-tissue]
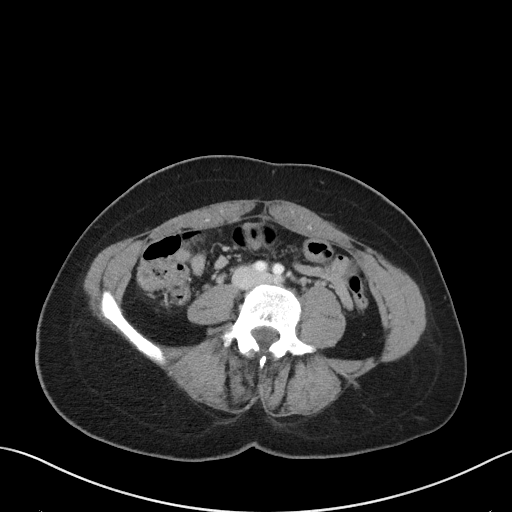
[im 45/87  soft-tissue]
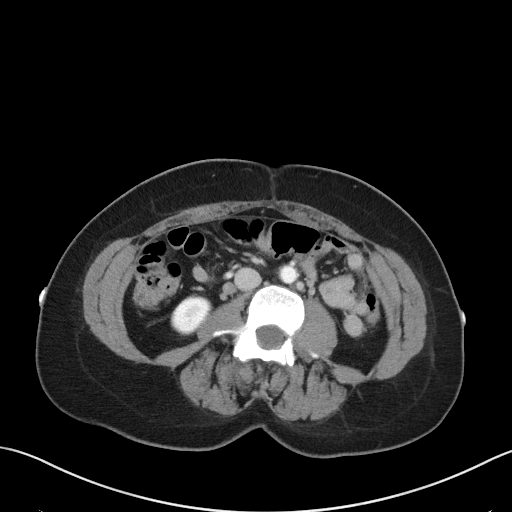
[im 49/87  soft-tissue]
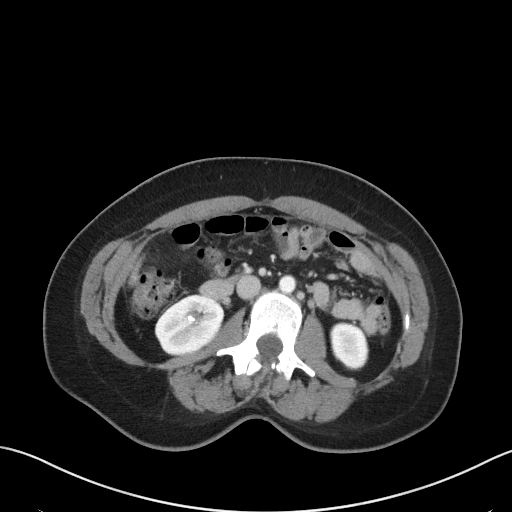
[im 57/87  soft-tissue]
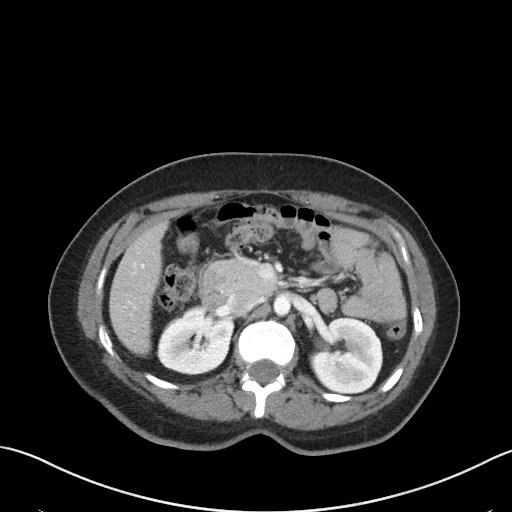
[im 57/87  bone]
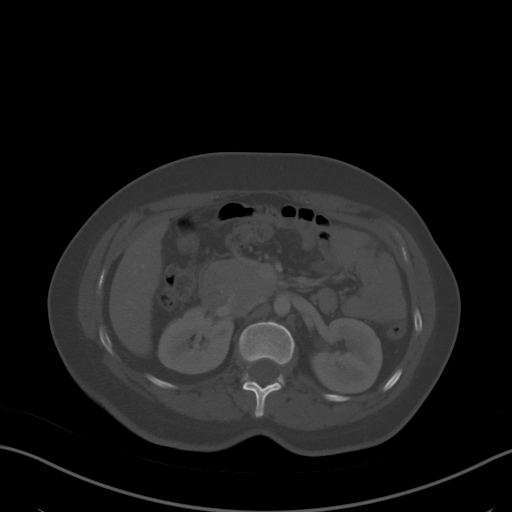
[im 64/87  soft-tissue]
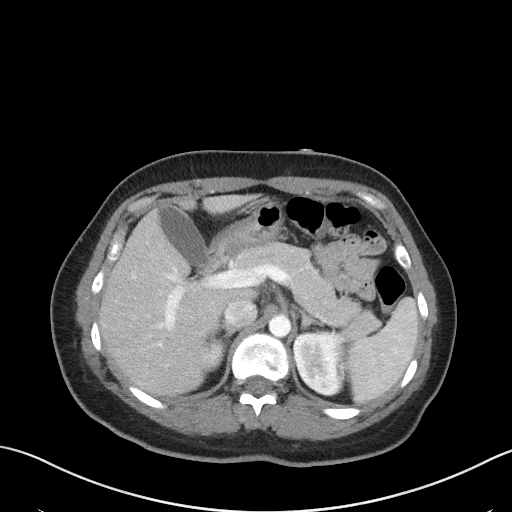
[im 68/87  soft-tissue]
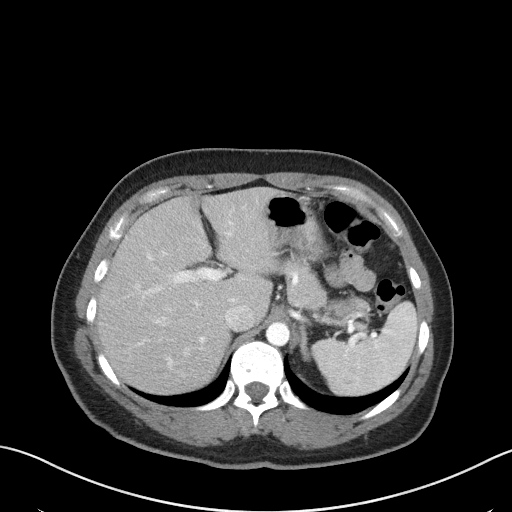
[im 75/87  soft-tissue]
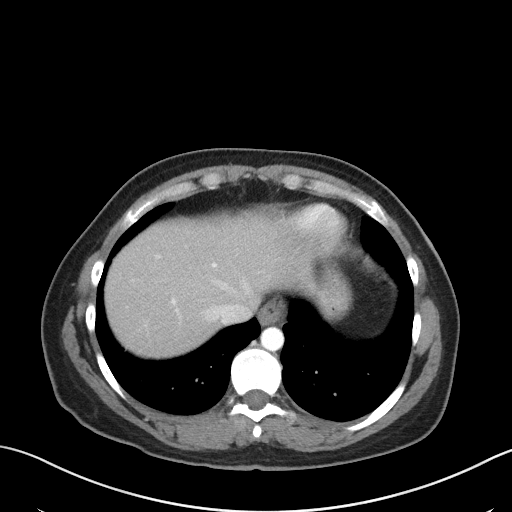
[im 83/87  soft-tissue]
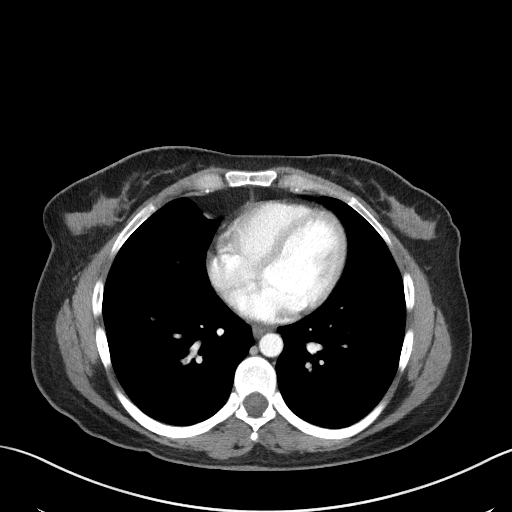

[Series 5: coronal st · coronal · 0.64mm/px · 3 of 83 slices shown]
[im 28/83  soft-tissue]
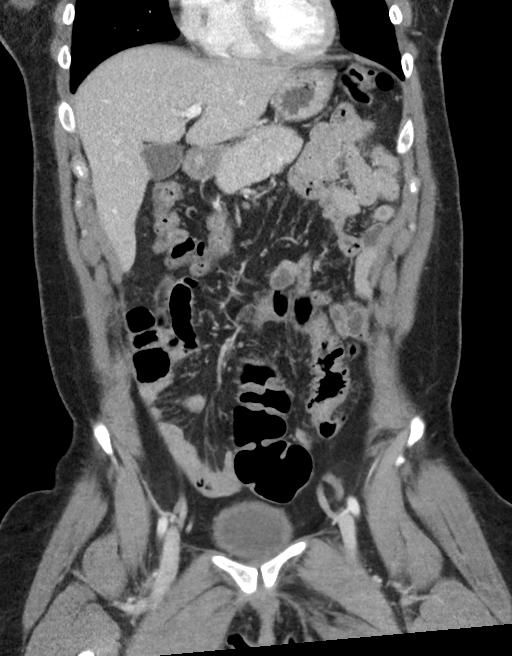
[im 37/83  soft-tissue]
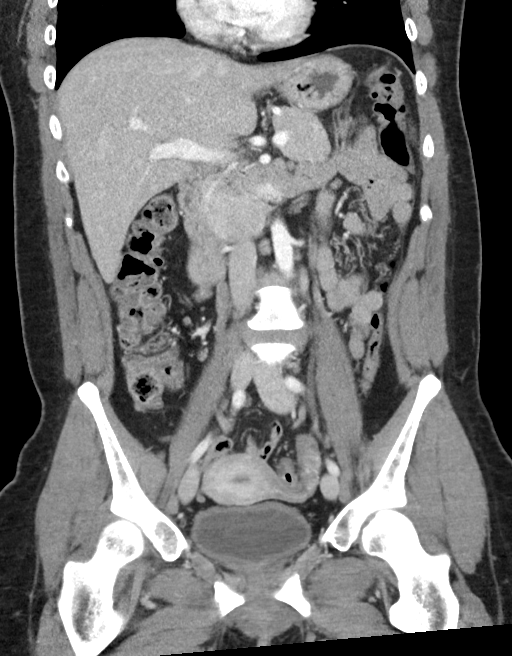
[im 46/83  soft-tissue]
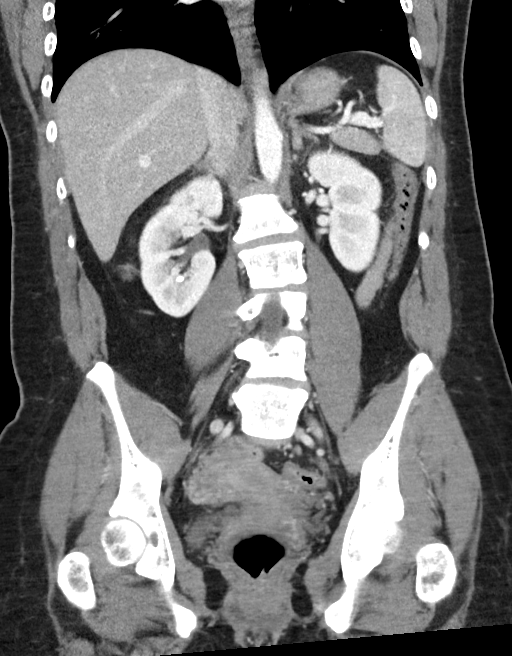

[16 of 46 positions shown; findings below may reference images not displayed]

FINDINGS: Lower chest: No acute abnormality.

Hepatobiliary: The liver and gallbladder are unremarkable. No
biliary dilatation.

Pancreas: Unremarkable

Spleen: Unremarkable

Adrenals/Urinary Tract: The kidneys, adrenal glands and bladder are
unremarkable except for a 6 mm nonobstructing RIGHT LOWER pole renal
calculus. There is no evidence of hydronephrosis or obstructing
urinary calculi.

Stomach/Bowel: Stomach is within normal limits. Appendix appears
normal. No evidence of bowel wall thickening, distention, or
inflammatory changes.

Vascular/Lymphatic: No significant vascular findings are present. No
enlarged abdominal or pelvic lymph nodes.

Reproductive: Uterus and bilateral adnexa are unremarkable.

Other: No ascites, focal collection or pneumoperitoneum.

Musculoskeletal: No acute or suspicious bony abnormalities are
noted. Bilateral L5 pars defects are noted without
spondylolisthesis. Surgical changes within the lumbar spine are
identified.
IMPRESSION: 1. No evidence of acute abnormality.
2. 6 mm nonobstructing RIGHT LOWER pole renal calculus.
3. Bilateral L5 pars defects without spondylolisthesis.

## 2021-04-25 MED ORDER — ONDANSETRON 4 MG PO TBDP
4.0000 mg | ORAL_TABLET | Freq: Once | ORAL | Status: AC
  Administered 2021-04-25: 4 mg via ORAL
  Filled 2021-04-25: qty 1

## 2021-04-25 MED ORDER — IOHEXOL 300 MG/ML  SOLN
100.0000 mL | Freq: Once | INTRAMUSCULAR | Status: AC | PRN
  Administered 2021-04-25: 100 mL via INTRAVENOUS
  Filled 2021-04-25: qty 100

## 2021-04-25 NOTE — ED Notes (Signed)
Patient was transported via wheelchair to the front of the ED to wait for ride whom were on their way. Patient was on her phone.

## 2021-04-25 NOTE — ED Notes (Signed)
Called to the room, patient c/o feeling dizzy. Patient instructed to get into bed, attached to bedside monitor.

## 2021-04-25 NOTE — ED Notes (Addendum)
Patient was assisted to hallway bathroom to void. Patient c/o nausea and continued chest pain. MD aware.

## 2021-04-25 NOTE — ED Provider Notes (Signed)
[facility] Emergency Department Provider Note    Event Date/Time   First MD Initiated Contact with Patient 04/25/21 [phone]     (approximate)  I have reviewed the triage vital signs and the nursing notes.   HISTORY  Chief Complaint Chest Pain (C/o recurrent midsternal CP and upper back pain x 2 months. Pt. States new symptom is that "my heart rate drops to the 30's or 50's when I'm sleeping." Pt. States she has been diagnosed with anxiety. Pt. States she has seen cardiology and PCP for same. )    HPI SIRENIA WHITIS is a 35 y.o. female below listed past medical history presents to the ER for multiple complaints primarily being concern for low heart rate that occurs while she is sleeping.  States that she has an apple watch and found that monitor her heart rate she will wake up and noticed that is gone down into the 50s.  No new medications.  Does have a history of palpitations.  Did recently start BuSpar on top of her Lexapro.  Has a history of anxiety.  Also complaining of abdominal pain.  Thinks that she may have pulled a muscle.  No trauma.  No fevers.  No dysuria.  Past Medical History:  Diagnosis Date   CP (cerebral palsy) (HCC)    No family history on file. Past Surgical History:  Procedure Laterality Date   BACK SURGERY     Patient Active Problem List   Diagnosis Date Noted   Chest pain, unspecified 04/03/2021   Indication for care in labor and delivery, antepartum 05/14/2019   Cerebral palsy (HCC) 11/27/2015   Restless leg syndrome 11/27/2015      Prior to Admission medications   Medication Sig Start Date End Date Taking? Authorizing Provider  gabapentin (NEURONTIN) 100 MG capsule Take 100 mg by mouth at bedtime.    [provider]  hydrOXYzine (ATARAX/VISTARIL) 10 MG tablet Take 1 tablet (10 mg total) by mouth 3 (three) times daily as needed for anxiety. 03/22/21   Phineas Semen, MD  oxyCODONE-acetaminophen (PERCOCET) 7.5-325 MG  tablet Take 1 tablet by mouth every 6 (six) hours as needed. 10/28/19   Joni Reining, PA-C  sertraline (ZOLOFT) 25 MG tablet Take 25 mg by mouth daily.    [provider]  sulfamethoxazole-trimethoprim (BACTRIM DS) 800-160 MG tablet Take 1 tablet by mouth 2 (two) times daily. 10/28/19   Joni Reining, PA-C    Allergies Patient has no known allergies.    Social History Social History   Tobacco Use   Smoking status: Never   Smokeless tobacco: Never  Vaping Use   Vaping Use: Never used  Substance Use Topics   Alcohol use: No   Drug use: Never    Review of Systems Patient denies headaches, rhinorrhea, blurry vision, numbness, shortness of breath, chest pain, edema, cough, abdominal pain, nausea, vomiting, diarrhea, dysuria, fevers, rashes or hallucinations unless otherwise stated above in HPI. ____________________________________________   PHYSICAL EXAM:  VITAL SIGNS: Vitals:   04/25/21 0444 04/25/21 1109  BP: (!) 128/91 131/88  Pulse: 84 (!) 102  Resp: 16 16  Temp: 97.7 F (36.5 C)   SpO2: 99% 100%    Constitutional: Alert and oriented.  Eyes: Conjunctivae are normal.  Head: Atraumatic. Nose: No congestion/rhinnorhea. Mouth/Throat: Mucous membranes are moist.   Neck: No stridor. Painless ROM.  Cardiovascular: Normal rate, regular rhythm. Grossly normal heart sounds.  Good peripheral circulation. Respiratory: Normal respiratory effort.  No retractions.  Lungs CTAB. Gastrointestinal: Soft and nontender. No distention. No abdominal bruits. No CVA tenderness. Genitourinary:  Musculoskeletal: No lower extremity tenderness nor edema.  No joint effusions. Neurologic:  No new gross focal neurologic deficits are appreciated. No facial droop Skin:  Skin is warm, dry and intact. No rash noted. Psychiatric: Mood and affect are anxious. Speech and behavior are normal.  ____________________________________________   LABS (all labs ordered are listed, but only  abnormal results are displayed)  Results for orders placed or performed during the hospital encounter of 04/25/21 (from the past 24 hour(s))  Basic metabolic panel     Status: Abnormal   Collection Time: 04/24/21  9:10 PM  Result Value Ref Range   Sodium 137 135 - 145 mmol/L   Potassium 3.9 3.5 - 5.1 mmol/L   Chloride 107 98 - 111 mmol/L   CO2 22 22 - 32 mmol/L   Glucose, Bld 120 (H) 70 - 99 mg/dL   BUN 10 6 - 20 mg/dL   Creatinine, Ser 4.50 0.44 - 1.00 mg/dL   Calcium 9.6 8.9 - 38.8 mg/dL   GFR, Estimated >82 >80 mL/min   Anion gap 8 5 - 15  CBC     Status: Abnormal   Collection Time: 04/24/21  9:10 PM  Result Value Ref Range   WBC 12.4 (H) 4.0 - 10.5 K/uL   RBC 4.47 3.87 - 5.11 MIL/uL   Hemoglobin 12.9 12.0 - 15.0 g/dL   HCT 03.4 91.7 - 91.5 %   MCV 85.2 80.0 - 100.0 fL   MCH 28.9 26.0 - 34.0 pg   MCHC 33.9 30.0 - 36.0 g/dL   RDW 05.6 97.9 - 48.0 %   Platelets 289 150 - 400 K/uL   nRBC 0.0 0.0 - 0.2 %  Troponin I (High Sensitivity)     Status: None   Collection Time: 04/24/21  9:10 PM  Result Value Ref Range   Troponin I (High Sensitivity) 3 <18 ng/L  Lipase, blood     Status: Abnormal   Collection Time: 04/24/21  9:10 PM  Result Value Ref Range   Lipase 55 (H) 11 - 51 U/L  hCG, quantitative, pregnancy     Status: None   Collection Time: 04/24/21  9:10 PM  Result Value Ref Range   hCG, Beta Chain, Quant, S <1 <5 mIU/mL  Hepatic function panel     Status: None   Collection Time: 04/24/21  9:10 PM  Result Value Ref Range   Total Protein 7.5 6.5 - 8.1 g/dL   Albumin 4.0 3.5 - 5.0 g/dL   AST 21 15 - 41 U/L   ALT 20 0 - 44 U/L   Alkaline Phosphatase 68 38 - 126 U/L   Total Bilirubin 0.3 0.3 - 1.2 mg/dL   Bilirubin, Direct <1.6 0.0 - 0.2 mg/dL   Indirect Bilirubin NOT CALCULATED 0.3 - 0.9 mg/dL  Urinalysis, Routine w reflex microscopic Urine, Clean Catch     Status: Abnormal   Collection Time: 04/25/21 10:01 AM  Result Value Ref Range   Color, Urine STRAW (A) YELLOW    APPearance CLEAR (A) CLEAR   Specific Gravity, Urine 1.035 (H) 1.005 - 1.030   pH 7.0 5.0 - 8.0   Glucose, UA NEGATIVE NEGATIVE mg/dL   Hgb urine dipstick NEGATIVE NEGATIVE   Bilirubin Urine NEGATIVE NEGATIVE   Ketones, ur 20 (A) NEGATIVE mg/dL   Protein, ur NEGATIVE NEGATIVE mg/dL   Nitrite NEGATIVE NEGATIVE   Leukocytes,Ua NEGATIVE NEGATIVE  POC urine  preg, ED     Status: None   Collection Time: 04/25/21 10:05 AM  Result Value Ref Range   Preg Test, Ur NEGATIVE NEGATIVE   ____________________________________________  EKG My review and personal interpretation at Time: 21:05   Indication: bradycardia  Rate: 90  Rhythm: sinus Axis: normal Other: normal intervals, no stemi or depressions ____________________________________________  RADIOLOGY  I personally reviewed all radiographic images ordered to evaluate for the above acute complaints and reviewed radiology reports and findings.  These findings were personally discussed with the patient.  Please see medical record for radiology report.  ____________________________________________   PROCEDURES  Procedure(s) performed:  Procedures    Critical Care performed: no ____________________________________________   INITIAL IMPRESSION / ASSESSMENT AND PLAN / ED COURSE  Pertinent labs & imaging results that were available during my care of the patient were reviewed by me and considered in my medical decision making (see chart for details).   DDX: Dysrhythmia, electrolyte abnormality, medication effect, ACS, CHF, obstruction, appendicitis, cholelithiasis, cholecystitis, pancreatitis, colitis, UTI  Mirela N Glendinning is a 35 y.o. who presents to the ED with presentation as described above.  Patient anxious but clinically very well-appearing.  Vital signs are stable.  EKG is nonischemic no sign of dysrhythmia.  Citation syndrome.  Her heart rates are normal here.  Lung sounds clear.  Does have some mild discomfort on abdominal  exam borderline white count but seems to always carry a bit of a white count.  CT imaging was ordered to evaluate for the but differential and is reassuring.  Given her reassuring work-up here in the ER I do believe that she is stable and appropriate for further work-up and management as an outpatient.  Patient agreeable to plan     The patient was evaluated in Emergency Department today for the symptoms described in the history of present illness. He/she was evaluated in the context of the global COVID-19 pandemic, which necessitated consideration that the patient might be at risk for infection with the SARS-CoV-2 virus that causes COVID-19. Institutional protocols and algorithms that pertain to the evaluation of patients at risk for COVID-19 are in a state of rapid change based on information released by regulatory bodies including the CDC and federal and state organizations. These policies and algorithms were followed during the patient's care in the ED.  As part of my medical decision making, I reviewed the following data within the electronic MEDICAL RECORD NUMBER Nursing notes reviewed and incorporated, Labs reviewed, notes from prior ED visits and [place] Controlled Substance Database   ____________________________________________   FINAL CLINICAL IMPRESSION(S) / ED DIAGNOSES  Final diagnoses:  Atypical chest pain  Generalized abdominal pain      NEW MEDICATIONS STARTED DURING THIS VISIT:  New Prescriptions   No medications on file     Note:  This document was prepared using Dragon voice recognition software and may include unintentional dictation errors.    Willy Eddy, MD 04/25/21 [phone]

## 2022-01-12 IMAGING — CR DG CHEST 2V
2 series · 2 of 2 positions shown · non-contrast
Comparison: 04/03/2021

CLINICAL DATA: Chest pain

EXAM:
CHEST - 2 VIEW

[chest lat]
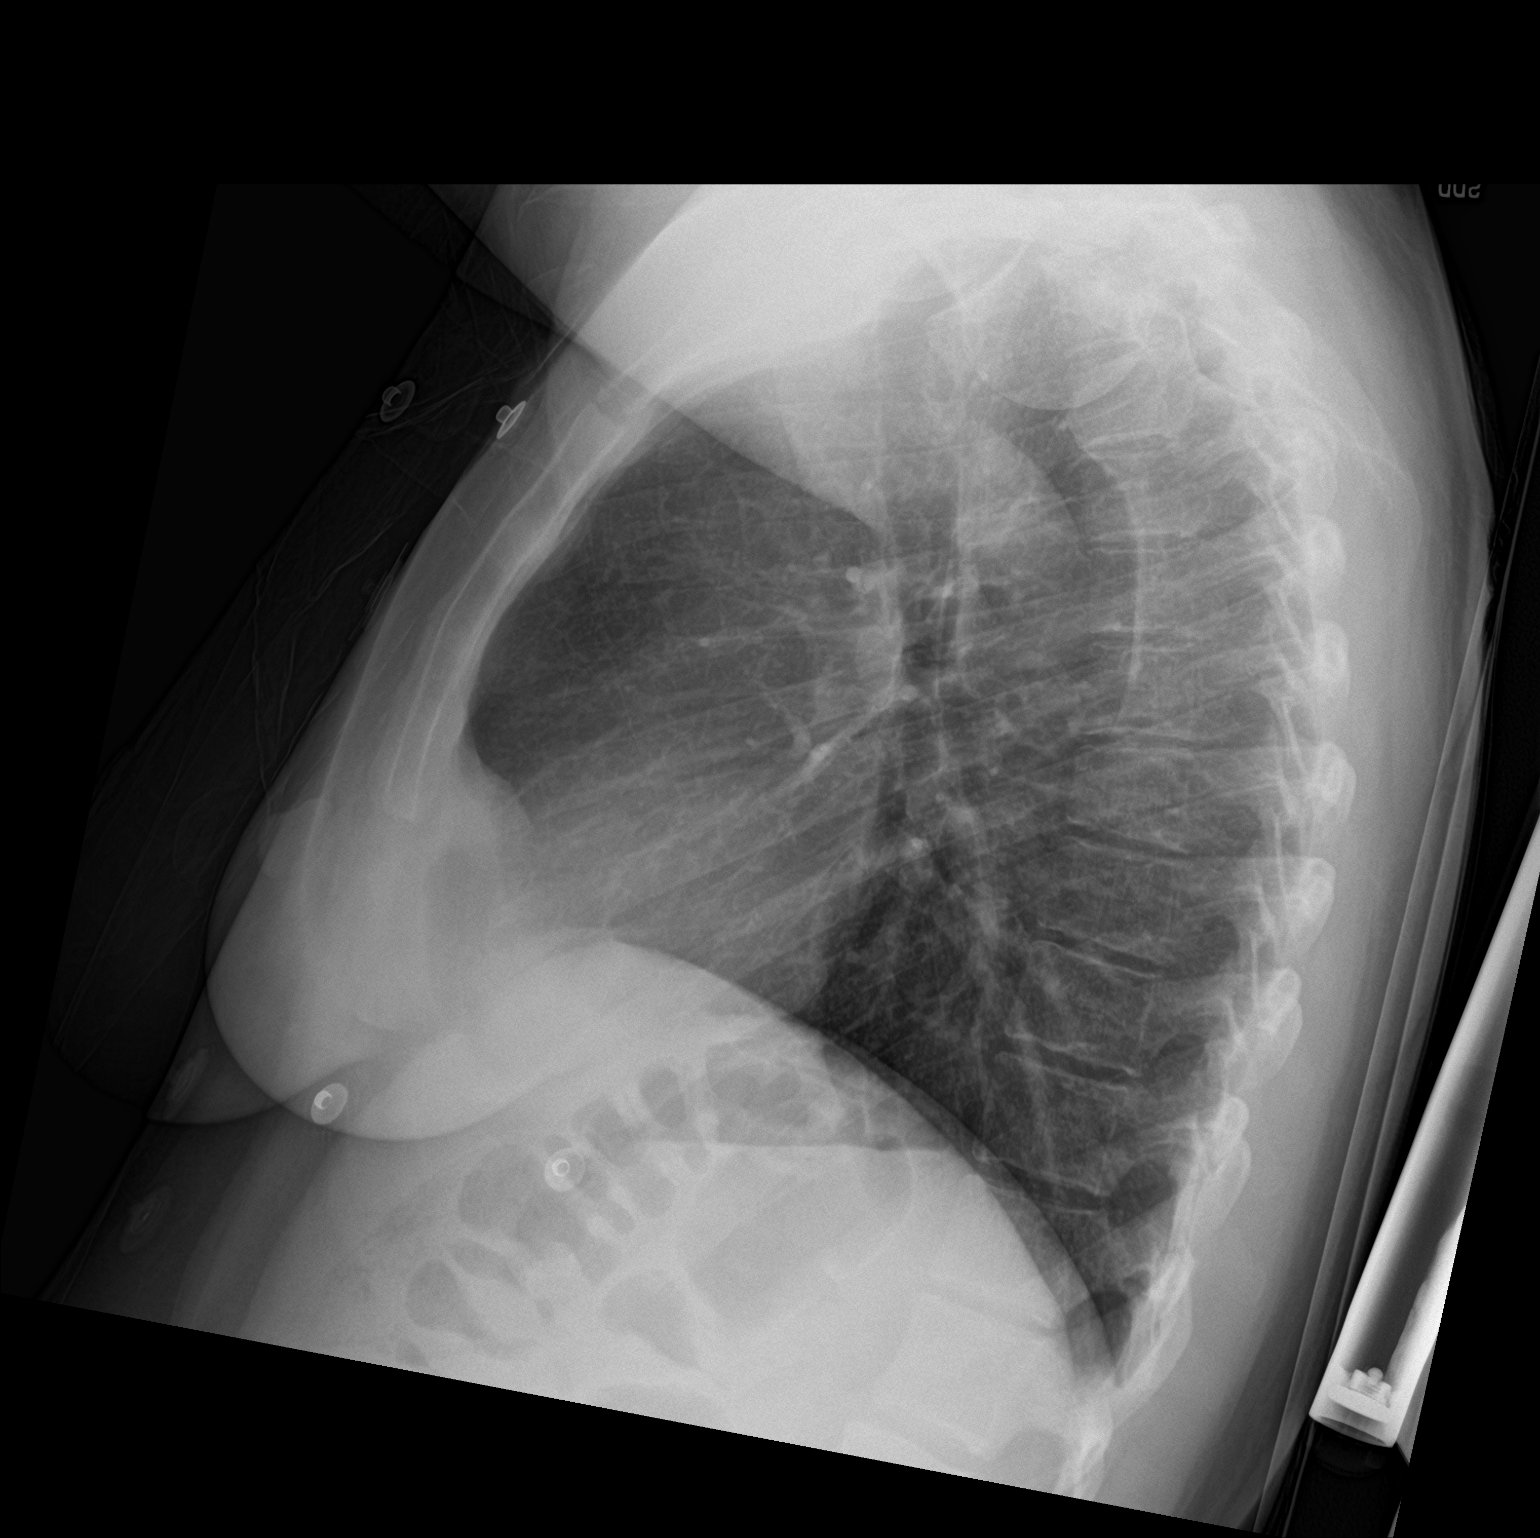

[chest ap]
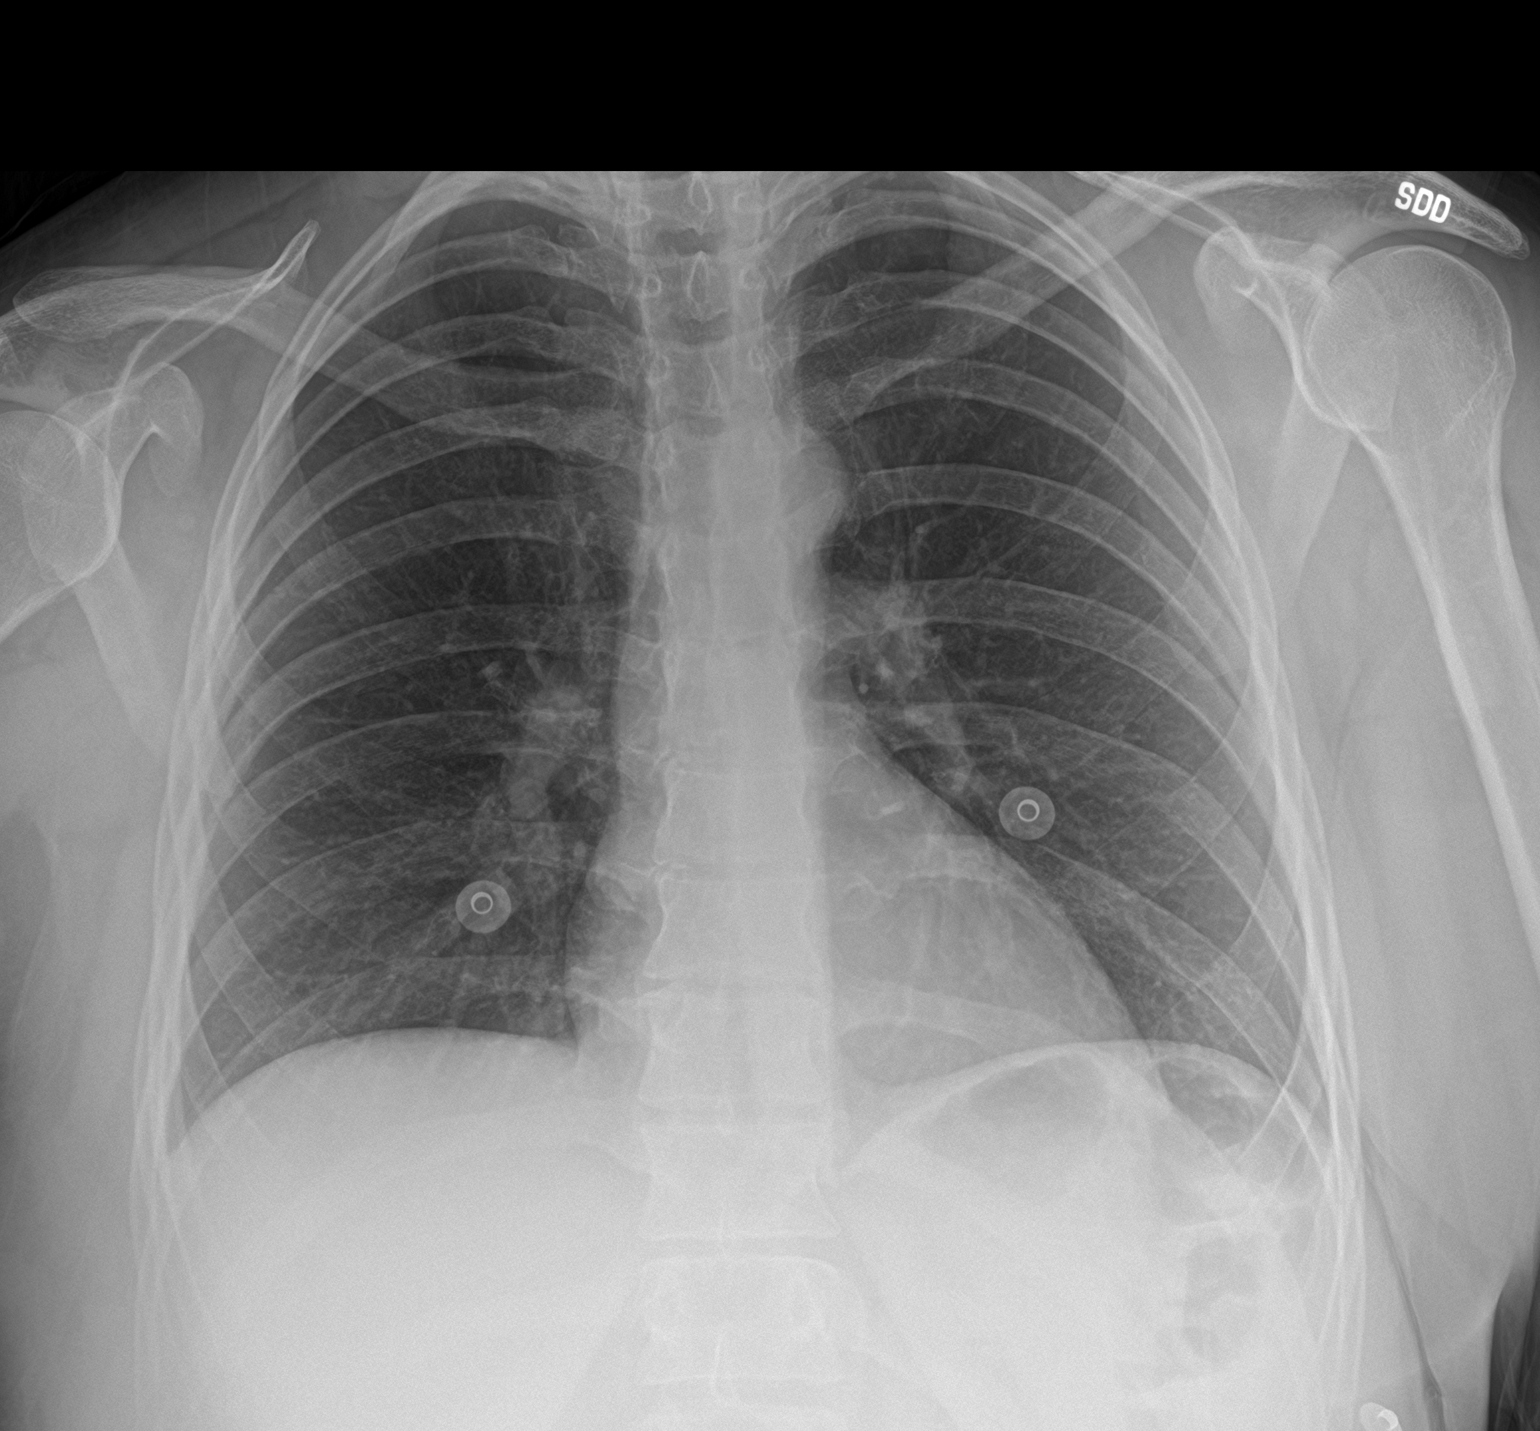

[2 of 2 positions shown; findings below may reference images not displayed]

FINDINGS: Lungs are clear.  No pleural effusion or pneumothorax.

The heart is normal in size.

Visualized osseous structures are within normal limits.
IMPRESSION: Normal chest radiographs.

## 2022-07-01 ENCOUNTER — Encounter: Payer: Self-pay | Admitting: Obstetrics & Gynecology

## 2022-07-01 ENCOUNTER — Ambulatory Visit (INDEPENDENT_AMBULATORY_CARE_PROVIDER_SITE_OTHER): Payer: Medicaid Other | Admitting: Obstetrics & Gynecology

## 2022-07-01 ENCOUNTER — Other Ambulatory Visit (HOSPITAL_COMMUNITY)
Admission: RE | Admit: 2022-07-01 | Discharge: 2022-07-01 | Disposition: A | Discharge status: Home / Self Care | Payer: Medicaid Other | Source: Ambulatory Visit | Attending: Obstetrics & Gynecology | Admitting: Obstetrics & Gynecology

## 2022-07-01 VITALS — BP 128/80 | Ht 59.0 in | Wt 158.0 lb

## 2022-07-01 DIAGNOSIS — O09899 Supervision of other high risk pregnancies, unspecified trimester: Secondary | ICD-10-CM | POA: Insufficient documentation

## 2022-07-01 DIAGNOSIS — O0932 Supervision of pregnancy with insufficient antenatal care, second trimester: Secondary | ICD-10-CM | POA: Diagnosis not present

## 2022-07-01 DIAGNOSIS — O10919 Unspecified pre-existing hypertension complicating pregnancy, unspecified trimester: Secondary | ICD-10-CM

## 2022-07-01 DIAGNOSIS — O09522 Supervision of elderly multigravida, second trimester: Secondary | ICD-10-CM

## 2022-07-01 DIAGNOSIS — O0930 Supervision of pregnancy with insufficient antenatal care, unspecified trimester: Secondary | ICD-10-CM

## 2022-07-01 DIAGNOSIS — E669 Obesity, unspecified: Secondary | ICD-10-CM

## 2022-07-01 DIAGNOSIS — O09892 Supervision of other high risk pregnancies, second trimester: Secondary | ICD-10-CM

## 2022-07-01 DIAGNOSIS — Z3A20 20 weeks gestation of pregnancy: Secondary | ICD-10-CM

## 2022-07-01 DIAGNOSIS — O99212 Obesity complicating pregnancy, second trimester: Secondary | ICD-10-CM

## 2022-07-01 DIAGNOSIS — O10012 Pre-existing essential hypertension complicating pregnancy, second trimester: Secondary | ICD-10-CM | POA: Diagnosis not present

## 2022-07-01 DIAGNOSIS — O99210 Obesity complicating pregnancy, unspecified trimester: Secondary | ICD-10-CM

## 2022-07-01 DIAGNOSIS — O34219 Maternal care for unspecified type scar from previous cesarean delivery: Secondary | ICD-10-CM

## 2022-07-01 DIAGNOSIS — O99213 Obesity complicating pregnancy, third trimester: Secondary | ICD-10-CM | POA: Insufficient documentation

## 2022-07-01 NOTE — Addendum Note (Signed)
Addended by: [name] on: 07/01/2022 04:44 PM   Modules accepted: Orders

## 2022-07-01 NOTE — Progress Notes (Signed)
  Subjective:    Robin Hughes is a 37 yo single G2P1 (70 yo son) being seen today for her first obstetrical visit.  This is a planned pregnancy. She is at [redacted]w[redacted]d gestation. Her obstetrical history is significant for advanced maternal age, CHTN, late prenatal care, previous cesarean section, maternal CP and obesity.  Relationship with FOB: significant other, not living together. Patient does not intend to breast feed. Pregnancy history fully reviewed.  Patient reports  stress, FOB left .  Review of Systems:   Review of Systems  Objective:     BP 128/80   Ht 4\' 11"  (1.499 m)   Wt 158 lb (71.7 kg)   LMP 02/11/2022   BMI 31.91 kg/m  Physical Exam  Exam Well nourished, well hydrated White female, no apparent distress She is ambulating with a walker and has a speech impediment. General:  alert   Breasts:  inspection negative, no nipple discharge or bleeding, no masses or nodularity palpable  Lungs: clear to auscultation bilaterally  Heart:  regular rate and rhythm, S1, S2 normal, no murmur, click, rub or gallop  Abdomen: soft, non-tender; bowel sounds normal; no masses,  no organomegaly   Vulva:  normal  Vagina: normal  Cervix:  No lesions, normal discharge  Corpus: 20 week size, FHR 130s  Adnexa:  not enlarged or painful  Rectal Exam: Not performed.        Assessment:    Pregnancy: G3P1001 Patient Active Problem List   Diagnosis Date Noted   Obesity in pregnancy 07/01/2022   Late prenatal care 07/01/2022   Multigravida of advanced maternal age in second trimester 07/01/2022   Supervision of other high risk pregnancies, unspecified trimester 07/01/2022   Previous cesarean delivery affecting pregnancy 07/01/2022   Chronic hypertension in pregnancy 07/01/2022   Chest pain, unspecified 04/03/2021   Indication for care in labor and delivery, antepartum 05/14/2019   Cerebral palsy ([place]) 11/27/2015   Restless leg syndrome 11/27/2015       Plan:     Initial labs  drawn including hba1c Prenatal vitamins. Problem list reviewed and updated. She did have a normal cell free DNA test at [facility]. Role of ultrasound in pregnancy discussed; fetal survey: ordered with MFM Check cmeta, UPC ratio  Please note she was scheduled for a pregnancy confirmation visit today, but I opted to do her NOB visit today to expedite her care.    Robin Hughes 07/01/2022

## 2022-07-01 NOTE — Addendum Note (Signed)
Addended by: Meryl Dare on: 07/01/2022 04:41 PM   Modules accepted: Orders

## 2022-07-02 LAB — CBC/D/PLT+RPR+RH+ABO+RUBIGG...
Antibody Screen: NEGATIVE
Basophils Absolute: 0.1 10*3/uL (ref 0.0–0.2)
Basos: 0 %
EOS (ABSOLUTE): 0.1 10*3/uL (ref 0.0–0.4)
Eos: 1 %
HCV Ab: NONREACTIVE
HIV Screen 4th Generation wRfx: NONREACTIVE
Hematocrit: 34.5 % (ref 34.0–46.6)
Hemoglobin: 11.5 g/dL (ref 11.1–15.9)
Hepatitis B Surface Ag: NEGATIVE
Immature Grans (Abs): 0 10*3/uL (ref 0.0–0.1)
Immature Granulocytes: 0 %
Lymphocytes Absolute: 1.9 10*3/uL (ref 0.7–3.1)
Lymphs: 17 %
MCH: 28.8 pg (ref 26.6–33.0)
MCHC: 33.3 g/dL (ref 31.5–35.7)
MCV: 87 fL (ref 79–97)
Monocytes Absolute: 0.7 10*3/uL (ref 0.1–0.9)
Monocytes: 6 %
Neutrophils Absolute: 8.6 10*3/uL — ABNORMAL HIGH (ref 1.4–7.0)
Neutrophils: 76 %
Platelets: 298 10*3/uL (ref 150–450)
RBC: 3.99 x10E6/uL (ref 3.77–5.28)
RDW: 13.6 % (ref 11.7–15.4)
RPR Ser Ql: NONREACTIVE
Rh Factor: POSITIVE
Rubella Antibodies, IGG: 2.05 index (ref >0.99)
Varicella zoster IgG: 473 index (ref >165)
WBC: 11.3 10*3/uL — ABNORMAL HIGH (ref 3.4–10.8)

## 2022-07-02 LAB — COMPREHENSIVE METABOLIC PANEL
ALT: 16 IU/L (ref 0–32)
AST: 14 IU/L (ref 0–40)
Albumin/Globulin Ratio: 1.4 (ref 1.2–2.2)
Albumin: 3.7 g/dL — ABNORMAL LOW (ref 3.9–4.9)
Alkaline Phosphatase: 68 IU/L (ref 44–121)
BUN/Creatinine Ratio: 11 (ref 9–23)
BUN: 5 mg/dL — ABNORMAL LOW (ref 6–20)
Bilirubin Total: <0.2 mg/dL (ref 0.0–1.2)
CO2: 19 mmol/L — ABNORMAL LOW (ref 20–29)
Calcium: 9.2 mg/dL (ref 8.7–10.2)
Chloride: 102 mmol/L (ref 96–106)
Creatinine, Ser: 0.46 mg/dL — ABNORMAL LOW (ref 0.57–1.00)
Globulin, Total: 2.7 g/dL (ref 1.5–4.5)
Glucose: 83 mg/dL (ref 70–99)
Potassium: 4.1 mmol/L (ref 3.5–5.2)
Sodium: 137 mmol/L (ref 134–144)
Total Protein: 6.4 g/dL (ref 6.0–8.5)
eGFR: 128 mL/min/{1.73_m2} (ref >59)

## 2022-07-02 LAB — HEMOGLOBIN A1C
Est. average glucose Bld gHb Est-mCnc: 111 mg/dL
Hgb A1c MFr Bld: 5.5 % (ref 4.8–5.6)

## 2022-07-02 LAB — HCV INTERPRETATION

## 2022-07-03 LAB — PROTEIN / CREATININE RATIO, URINE
Creatinine, Urine: 182.1 mg/dL
Protein, Ur: 32.3 mg/dL
Protein/Creat Ratio: 177 mg/g creat (ref 0–200)

## 2022-07-05 LAB — CYTOLOGY - PAP
Chlamydia: NEGATIVE
Comment: NEGATIVE
Comment: NORMAL
Diagnosis: NEGATIVE
Neisseria Gonorrhea: NEGATIVE

## 2022-07-06 ENCOUNTER — Other Ambulatory Visit: Payer: Self-pay | Admitting: Obstetrics & Gynecology

## 2022-07-06 DIAGNOSIS — Z3689 Encounter for other specified antenatal screening: Secondary | ICD-10-CM

## 2022-07-18 ENCOUNTER — Other Ambulatory Visit: Payer: Medicaid Other

## 2022-07-19 ENCOUNTER — Telehealth: Payer: Self-pay | Admitting: Obstetrics

## 2022-07-19 NOTE — Telephone Encounter (Signed)
Tried reaching out to patient in regards to rescheduling her appt on 07/22/22 with [facility] her vm was full. Sent patient mychart message to call office back to r/s

## 2022-07-20 ENCOUNTER — Encounter: Payer: Medicaid Other | Admitting: Certified Nurse Midwife

## 2022-07-22 ENCOUNTER — Encounter: Payer: Medicaid Other | Admitting: Obstetrics

## 2022-07-26 ENCOUNTER — Encounter: Payer: Self-pay | Admitting: Obstetrics and Gynecology

## 2022-07-26 ENCOUNTER — Ambulatory Visit (INDEPENDENT_AMBULATORY_CARE_PROVIDER_SITE_OTHER): Payer: Medicaid Other | Admitting: Obstetrics and Gynecology

## 2022-07-26 VITALS — BP 115/81 | HR 94 | Wt 159.7 lb

## 2022-07-26 DIAGNOSIS — O09899 Supervision of other high risk pregnancies, unspecified trimester: Secondary | ICD-10-CM

## 2022-07-26 DIAGNOSIS — Z131 Encounter for screening for diabetes mellitus: Secondary | ICD-10-CM

## 2022-07-26 DIAGNOSIS — Z3A23 23 weeks gestation of pregnancy: Secondary | ICD-10-CM

## 2022-07-26 DIAGNOSIS — Z113 Encounter for screening for infections with a predominantly sexual mode of transmission: Secondary | ICD-10-CM

## 2022-07-26 DIAGNOSIS — O09522 Supervision of elderly multigravida, second trimester: Secondary | ICD-10-CM

## 2022-07-26 LAB — POCT URINALYSIS DIPSTICK OB
Bilirubin, UA: NEGATIVE
Blood, UA: NEGATIVE
Glucose, UA: NEGATIVE
Ketones, UA: NEGATIVE
Leukocytes, UA: NEGATIVE
Nitrite, UA: NEGATIVE
POC,PROTEIN,UA: NEGATIVE
Spec Grav, UA: 1.015 (ref 1.010–1.025)
Urobilinogen, UA: 0.2 E.U./dL
pH, UA: 6 (ref 5.0–8.0)

## 2022-07-26 NOTE — Progress Notes (Signed)
ROB. Patient states daily fetal movement along with occasional round ligament pains. She states no questions or concerns at this time.

## 2022-07-26 NOTE — Progress Notes (Signed)
ROB: Reports daily fetal movement.  Has affirmed her desire for repeat cesarean delivery.  1 hour GCT next visit.  Hypertension currently controlled.  Taking vitamins as directed.

## 2022-08-01 ENCOUNTER — Other Ambulatory Visit: Payer: Medicaid Other

## 2022-08-02 ENCOUNTER — Ambulatory Visit: Admission: RE | Admit: 2022-08-02 | Payer: Medicaid Other | Source: Ambulatory Visit

## 2022-08-03 NOTE — Telephone Encounter (Signed)
The patient was seen on 1/30 with DJE

## 2022-08-04 ENCOUNTER — Ambulatory Visit: Payer: Medicaid Other

## 2022-08-23 ENCOUNTER — Other Ambulatory Visit: Payer: Medicaid Other

## 2022-08-23 ENCOUNTER — Encounter: Payer: Medicaid Other | Admitting: Obstetrics and Gynecology

## 2022-08-23 DIAGNOSIS — O0992 Supervision of high risk pregnancy, unspecified, second trimester: Secondary | ICD-10-CM

## 2022-08-23 DIAGNOSIS — Z3A27 27 weeks gestation of pregnancy: Secondary | ICD-10-CM

## 2022-08-23 DIAGNOSIS — Z23 Encounter for immunization: Secondary | ICD-10-CM

## 2022-08-25 ENCOUNTER — Encounter: Payer: Medicaid Other | Admitting: Obstetrics and Gynecology

## 2022-08-25 ENCOUNTER — Other Ambulatory Visit: Payer: Medicaid Other

## 2022-08-25 DIAGNOSIS — O0992 Supervision of high risk pregnancy, unspecified, second trimester: Secondary | ICD-10-CM

## 2022-08-25 DIAGNOSIS — Z3A27 27 weeks gestation of pregnancy: Secondary | ICD-10-CM

## 2022-08-25 DIAGNOSIS — Z23 Encounter for immunization: Secondary | ICD-10-CM

## 2022-08-26 ENCOUNTER — Other Ambulatory Visit: Payer: Medicaid Other

## 2022-08-26 DIAGNOSIS — Z113 Encounter for screening for infections with a predominantly sexual mode of transmission: Secondary | ICD-10-CM

## 2022-08-26 DIAGNOSIS — O09899 Supervision of other high risk pregnancies, unspecified trimester: Secondary | ICD-10-CM

## 2022-08-26 DIAGNOSIS — Z131 Encounter for screening for diabetes mellitus: Secondary | ICD-10-CM

## 2022-08-27 LAB — 28 WEEK RH+PANEL
Basophils Absolute: 0 10*3/uL (ref 0.0–0.2)
Basos: 0 %
EOS (ABSOLUTE): 0.1 10*3/uL (ref 0.0–0.4)
Eos: 1 %
Gestational Diabetes Screen: 131 mg/dL (ref 70–139)
HIV Screen 4th Generation wRfx: NONREACTIVE
Hematocrit: 30.7 % — ABNORMAL LOW (ref 34.0–46.6)
Hemoglobin: 10 g/dL — ABNORMAL LOW (ref 11.1–15.9)
Immature Grans (Abs): 0 10*3/uL (ref 0.0–0.1)
Immature Granulocytes: 0 %
Lymphocytes Absolute: 1.7 10*3/uL (ref 0.7–3.1)
Lymphs: 15 %
MCH: 27 pg (ref 26.6–33.0)
MCHC: 32.6 g/dL (ref 31.5–35.7)
MCV: 83 fL (ref 79–97)
Monocytes Absolute: 0.6 10*3/uL (ref 0.1–0.9)
Monocytes: 6 %
Neutrophils Absolute: 8.5 10*3/uL — ABNORMAL HIGH (ref 1.4–7.0)
Neutrophils: 78 %
Platelets: 310 10*3/uL (ref 150–450)
RBC: 3.71 x10E6/uL — ABNORMAL LOW (ref 3.77–5.28)
RDW: 13.5 % (ref 11.7–15.4)
RPR Ser Ql: NONREACTIVE
WBC: 11 10*3/uL — ABNORMAL HIGH (ref 3.4–10.8)

## 2022-08-30 ENCOUNTER — Other Ambulatory Visit: Payer: Self-pay

## 2022-08-30 ENCOUNTER — Ambulatory Visit: Payer: Medicaid Other | Attending: Maternal & Fetal Medicine

## 2022-08-30 DIAGNOSIS — O09523 Supervision of elderly multigravida, third trimester: Secondary | ICD-10-CM | POA: Insufficient documentation

## 2022-08-30 DIAGNOSIS — G809 Cerebral palsy, unspecified: Secondary | ICD-10-CM | POA: Diagnosis not present

## 2022-08-30 DIAGNOSIS — E669 Obesity, unspecified: Secondary | ICD-10-CM

## 2022-08-30 DIAGNOSIS — O10013 Pre-existing essential hypertension complicating pregnancy, third trimester: Secondary | ICD-10-CM

## 2022-08-30 DIAGNOSIS — O99353 Diseases of the nervous system complicating pregnancy, third trimester: Secondary | ICD-10-CM | POA: Diagnosis not present

## 2022-08-30 DIAGNOSIS — O99213 Obesity complicating pregnancy, third trimester: Secondary | ICD-10-CM

## 2022-08-30 DIAGNOSIS — Z363 Encounter for antenatal screening for malformations: Secondary | ICD-10-CM | POA: Insufficient documentation

## 2022-08-30 DIAGNOSIS — O09522 Supervision of elderly multigravida, second trimester: Secondary | ICD-10-CM

## 2022-08-30 DIAGNOSIS — O0933 Supervision of pregnancy with insufficient antenatal care, third trimester: Secondary | ICD-10-CM | POA: Insufficient documentation

## 2022-08-30 DIAGNOSIS — O09899 Supervision of other high risk pregnancies, unspecified trimester: Secondary | ICD-10-CM

## 2022-08-30 DIAGNOSIS — Z3A28 28 weeks gestation of pregnancy: Secondary | ICD-10-CM | POA: Insufficient documentation

## 2022-08-30 DIAGNOSIS — O0930 Supervision of pregnancy with insufficient antenatal care, unspecified trimester: Secondary | ICD-10-CM

## 2022-08-30 DIAGNOSIS — O99210 Obesity complicating pregnancy, unspecified trimester: Secondary | ICD-10-CM

## 2022-08-30 DIAGNOSIS — O10919 Unspecified pre-existing hypertension complicating pregnancy, unspecified trimester: Secondary | ICD-10-CM

## 2022-09-07 ENCOUNTER — Ambulatory Visit (INDEPENDENT_AMBULATORY_CARE_PROVIDER_SITE_OTHER): Payer: Medicaid Other | Admitting: Certified Nurse Midwife

## 2022-09-07 ENCOUNTER — Encounter: Payer: Self-pay | Admitting: Certified Nurse Midwife

## 2022-09-07 VITALS — BP 125/82 | HR 108 | Wt 165.8 lb

## 2022-09-07 DIAGNOSIS — Z3A29 29 weeks gestation of pregnancy: Secondary | ICD-10-CM

## 2022-09-07 DIAGNOSIS — Z3483 Encounter for supervision of other normal pregnancy, third trimester: Secondary | ICD-10-CM

## 2022-09-07 NOTE — Patient Instructions (Signed)
[facility]  [facility]  [address], [place], [place] [address]  Phone: [phone]  [facility] (second location)  [address] [place], [place] [address]  Phone: [phone]  [facility] ([place]) [address], [place], [place] [address] Phone: [phone]  [facility]  [address] [place], [place] [address]  Phone: [phone] 

## 2022-09-07 NOTE — Progress Notes (Signed)
ROB doing well, feeling good movement. Denies cramping /contractions. NST at 32 wks for chronic hypertension. U/s 3/5 with MDM baby measures 72 percentile. She has follow up 4 wks. No concerns today. Follow up 2 wks.   Philip Aspen, CNM

## 2022-09-12 ENCOUNTER — Other Ambulatory Visit: Payer: Self-pay

## 2022-09-12 ENCOUNTER — Encounter: Payer: Self-pay | Admitting: Obstetrics

## 2022-09-12 ENCOUNTER — Observation Stay
Admission: EM | Admit: 2022-09-12 | Discharge: 2022-09-12 | Disposition: A | Discharge status: Home / Self Care | Payer: Medicaid Other | Attending: Obstetrics | Admitting: Obstetrics

## 2022-09-12 DIAGNOSIS — R109 Unspecified abdominal pain: Secondary | ICD-10-CM | POA: Diagnosis not present

## 2022-09-12 DIAGNOSIS — R42 Dizziness and giddiness: Secondary | ICD-10-CM | POA: Diagnosis not present

## 2022-09-12 DIAGNOSIS — Z79899 Other long term (current) drug therapy: Secondary | ICD-10-CM | POA: Insufficient documentation

## 2022-09-12 DIAGNOSIS — R1030 Lower abdominal pain, unspecified: Secondary | ICD-10-CM | POA: Diagnosis not present

## 2022-09-12 DIAGNOSIS — Z3A30 30 weeks gestation of pregnancy: Secondary | ICD-10-CM

## 2022-09-12 DIAGNOSIS — O26893 Other specified pregnancy related conditions, third trimester: Secondary | ICD-10-CM | POA: Diagnosis present

## 2022-09-12 DIAGNOSIS — R197 Diarrhea, unspecified: Secondary | ICD-10-CM | POA: Diagnosis not present

## 2022-09-12 DIAGNOSIS — O99891 Other specified diseases and conditions complicating pregnancy: Secondary | ICD-10-CM | POA: Insufficient documentation

## 2022-09-12 DIAGNOSIS — O10013 Pre-existing essential hypertension complicating pregnancy, third trimester: Secondary | ICD-10-CM

## 2022-09-12 DIAGNOSIS — O09523 Supervision of elderly multigravida, third trimester: Secondary | ICD-10-CM | POA: Insufficient documentation

## 2022-09-12 DIAGNOSIS — R55 Syncope and collapse: Secondary | ICD-10-CM | POA: Insufficient documentation

## 2022-09-12 DIAGNOSIS — R110 Nausea: Secondary | ICD-10-CM | POA: Insufficient documentation

## 2022-09-12 HISTORY — DX: Essential (primary) hypertension: I10

## 2022-09-12 LAB — URINALYSIS, COMPLETE (UACMP) WITH MICROSCOPIC
Bilirubin Urine: NEGATIVE
Glucose, UA: NEGATIVE mg/dL
Hgb urine dipstick: NEGATIVE
Ketones, ur: NEGATIVE mg/dL
Leukocytes,Ua: NEGATIVE
Nitrite: NEGATIVE
Protein, ur: NEGATIVE mg/dL
Specific Gravity, Urine: 1.014 (ref 1.005–1.030)
pH: 7 (ref 5.0–8.0)

## 2022-09-12 LAB — COMPREHENSIVE METABOLIC PANEL
ALT: 15 U/L (ref 0–44)
AST: 22 U/L (ref 15–41)
Albumin: 2.8 g/dL — ABNORMAL LOW (ref 3.5–5.0)
Alkaline Phosphatase: 97 U/L (ref 38–126)
Anion gap: 9 (ref 5–15)
BUN: 6 mg/dL (ref 6–20)
CO2: 23 mmol/L (ref 22–32)
Calcium: 8.7 mg/dL — ABNORMAL LOW (ref 8.9–10.3)
Chloride: 104 mmol/L (ref 98–111)
Creatinine, Ser: 0.56 mg/dL (ref 0.44–1.00)
GFR, Estimated: >60 mL/min (ref >60)
Glucose, Bld: 107 mg/dL — ABNORMAL HIGH (ref 70–99)
Potassium: 3.8 mmol/L (ref 3.5–5.1)
Sodium: 136 mmol/L (ref 135–145)
Total Bilirubin: 0.4 mg/dL (ref 0.3–1.2)
Total Protein: 7.3 g/dL (ref 6.5–8.1)

## 2022-09-12 LAB — CBC WITH DIFFERENTIAL/PLATELET
Abs Immature Granulocytes: 0.06 10*3/uL (ref 0.00–0.07)
Basophils Absolute: 0 10*3/uL (ref 0.0–0.1)
Basophils Relative: 0 %
Eosinophils Absolute: 0.2 10*3/uL (ref 0.0–0.5)
Eosinophils Relative: 2 %
HCT: 31.6 % — ABNORMAL LOW (ref 36.0–46.0)
Hemoglobin: 9.8 g/dL — ABNORMAL LOW (ref 12.0–15.0)
Immature Granulocytes: 1 %
Lymphocytes Relative: 17 %
Lymphs Abs: 2.2 10*3/uL (ref 0.7–4.0)
MCH: 25.7 pg — ABNORMAL LOW (ref 26.0–34.0)
MCHC: 31 g/dL (ref 30.0–36.0)
MCV: 82.7 fL (ref 80.0–100.0)
Monocytes Absolute: 1 10*3/uL (ref 0.1–1.0)
Monocytes Relative: 8 %
Neutro Abs: 9.6 10*3/uL — ABNORMAL HIGH (ref 1.7–7.7)
Neutrophils Relative %: 72 %
Platelets: 307 10*3/uL (ref 150–400)
RBC: 3.82 MIL/uL — ABNORMAL LOW (ref 3.87–5.11)
RDW: 14.3 % (ref 11.5–15.5)
WBC: 13.1 10*3/uL — ABNORMAL HIGH (ref 4.0–10.5)
nRBC: 0 % (ref 0.0–0.2)

## 2022-09-12 LAB — GLUCOSE, CAPILLARY: Glucose-Capillary: 85 mg/dL (ref 70–99)

## 2022-09-12 MED ORDER — DEXTROSE 5 % IN LACTATED RINGERS IV BOLUS
500.0000 mL | Freq: Once | INTRAVENOUS | Status: AC
  Administered 2022-09-12: 500 mL via INTRAVENOUS

## 2022-09-12 MED ORDER — SOD CITRATE-CITRIC ACID 500-334 MG/5ML PO SOLN: 30.0000 mL | ORAL | Status: DC | PRN

## 2022-09-12 MED ORDER — ONDANSETRON HCL 4 MG/2ML IJ SOLN: 4.0000 mg | Freq: Four times a day (QID) | INTRAMUSCULAR | Status: DC | PRN

## 2022-09-12 MED ORDER — FERROUS SULFATE 325 (65 FE) MG PO TBEC: 325.0000 mg | DELAYED_RELEASE_TABLET | ORAL | 3 refills | Status: AC

## 2022-09-12 MED ORDER — ACETAMINOPHEN 325 MG PO TABS: 650.0000 mg | ORAL_TABLET | ORAL | Status: DC | PRN

## 2022-09-12 MED ORDER — LACTATED RINGERS IV SOLN: 500.0000 mL | INTRAVENOUS | Status: DC | PRN

## 2022-09-12 NOTE — OB Triage Note (Signed)
LABOR & DELIVERY OB TRIAGE NOTE  SUBJECTIVE  HPI Robin Hughes is a 37 y.o. G2P1001 at [redacted]w[redacted]d. She is feeling much better after receiving a liter of IV fluids and eating a sandwich.  She is no longer lightheaded or tachycardic. Her abdominal pain has improved. CBC, CMP, and UA were not concerning for infection or active bleeding. She is mildly anemic.   OB History     Gravida  2   Para  1   Term  1   Preterm      AB      Living  1      SAB      IAB      Ectopic      Multiple      Live Births  1           Scheduled Meds: Continuous Infusions:  lactated ringers     PRN Meds:.acetaminophen, lactated ringers, ondansetron, sodium citrate-citric acid  OBJECTIVE  BP 117/80   Pulse 83   Temp 97.7 F (36.5 C) (Oral)   Resp 20   Ht 4\' 11"  (1.499 m)   Wt 74.8 kg   LMP 02/11/2022   SpO2 100%   BMI 33.33 kg/m    NST I reviewed the NST and it was reactive.  Baseline: 140 Variability: moderate Accelerations: present Decelerations:none Toco: no contractions Category 1  ASSESSMENT Impression  1) Pregnancy at G2P1001, [redacted]w[redacted]d, Estimated Date of Delivery: 11/18/22 2) Reassuring maternal/fetal status 3) Dehydration, resolved  PLAN  1) Discharge home with standard return/labor precautions. 2) Encouraged active hydration and frequent meals. Reviewed sources of protein. 3) Reviewed findings with Dr. Amalia Hailey. Will hold amlodipine for 2 days and follow up in office on 09/14/22.  Yackelin Vanbrocklin, CNM 09/12/22  6:30 PM

## 2022-09-12 NOTE — Discharge Instructions (Signed)
Schedule follow up appt on Wednesday 09/14/22. Hold blood pressure medication until follow up appt

## 2022-09-12 NOTE — OB Triage Note (Signed)
Pt G2P1 [redacted]w[redacted]d presents for dull intermittent lower abd pain since last night that comes every 81min. Pt rates pain 8/10. Reports +FM. Denies LOF/bleeding. Pt reports she didn't sleep last night and slept most of the day today until her mom woke her up and called EMS. Reports she has not ate of drank today. Reports CHTN and anxiety.

## 2022-09-12 NOTE — OB Triage Note (Signed)
LABOR & DELIVERY OB TRIAGE NOTE  SUBJECTIVE  HPI Robin Hughes is a 37 y.o. G2P1001 at [redacted]w[redacted]d who presents to Labor & Delivery for abdominal pain. She reports intermittent pain in her lower abdomen since last night. She reports diarrhea and nausea last night and today but denies vomiting. She states that she has not eaten anything today because she "did not have time." She has been having periods of lightheadedness and diaphoresis. She denies recent exposure to illness. She has CHTN and takes amlodipine. She did not take her dose today. Her initial BP was 86/60 and repeat was 102/63.  OB History     Gravida  2   Para  1   Term  1   Preterm      AB      Living  1      SAB      IAB      Ectopic      Multiple      Live Births  1           Scheduled Meds: Continuous Infusions:  lactated ringers     PRN Meds:.acetaminophen, lactated ringers, ondansetron, sodium citrate-citric acid  OBJECTIVE  BP 102/63   Pulse (!) 114   Temp 97.7 F (36.5 C) (Oral)   Resp 20   Ht 4\' 11"  (1.499 m)   Wt 74.8 kg   LMP 02/11/2022   BMI 33.33 kg/m   General: alert, cooperative, pale Heart: RRR, tachycardic Lungs: CTAB Abdomen: soft, gravid, non-tender to palpation Cervical exam: deferred  NST I reviewed the NST and it was reactive.  Baseline: 140 Variability: moderate Accelerations: present Decelerations:none Toco: no contractions Category 1  Capillary blood glucose: 85 ASSESSMENT Impression  1) Pregnancy at G2P1001, [redacted]w[redacted]d, Estimated Date of Delivery: 11/18/22.   Reassuring maternal/fetal status 2) Near-syncopal episodes likely related to dehydration  PLAN  1) IV hydration, PO nutrition 2) CBC, CMP, UA  Robin Hughes, CNM 09/12/22  4:00 PM

## 2022-09-13 ENCOUNTER — Telehealth: Payer: Self-pay | Admitting: Obstetrics

## 2022-09-13 NOTE — Telephone Encounter (Signed)
The patient called back to confirm scheduled appointment.

## 2022-09-13 NOTE — Telephone Encounter (Signed)
I attempt to reach the patient x3 via phone. No answer and voicemail is full.

## 2022-09-13 NOTE — Telephone Encounter (Signed)
-----   Message from Lurlean Horns, CNM sent at 09/13/2022 12:47 PM EDT ----- Regarding: RE: Follow up appointment yes ----- Message ----- From: Audree Camel Sent: 09/13/2022   9:12 AM EDT To: Lurlean Horns, CNM; # Subject: RE: Follow up appointment                      Missy, we have no opening right now this week. Can I work her in with you that AM? ----- Message ----- From: Lurlean Horns, CNM Sent: 09/12/2022   6:16 PM EDT To: [facility] Subject: Follow up appointment                          Hi! Can you please get Robin Hughes scheduled for a ROB on Wednesday? She was seen in triage today. She can see any provider.  Thank you!  Missy

## 2022-09-14 ENCOUNTER — Encounter: Payer: Medicaid Other | Admitting: Obstetrics and Gynecology

## 2022-09-20 ENCOUNTER — Other Ambulatory Visit: Payer: Medicaid Other

## 2022-09-20 ENCOUNTER — Encounter: Payer: Medicaid Other | Admitting: Obstetrics

## 2022-09-20 DIAGNOSIS — Z3A31 31 weeks gestation of pregnancy: Secondary | ICD-10-CM | POA: Insufficient documentation

## 2022-09-20 DIAGNOSIS — O10913 Unspecified pre-existing hypertension complicating pregnancy, third trimester: Secondary | ICD-10-CM | POA: Insufficient documentation

## 2022-09-20 NOTE — Patient Instructions (Signed)

## 2022-09-20 NOTE — Progress Notes (Deleted)
    NURSE VISIT NOTE  Subjective:    Patient ID: MYJA REXING, female    DOB: 02/02/1986, 37 y.o.   MRN: [id number]  HPI  Patient is a 37 y.o. G20P1001 female who presents for fetal monitoring per order from Philip Aspen, CNM.   Objective:    LMP 02/11/2022  Estimated Date of Delivery: 11/18/22  [redacted]w[redacted]d  Fetus A Non-Stress Test Interpretation for 09/20/22  Indication: Chronic Hypertenstion and Obesity            Assessment:   1. Maternal chronic hypertension in third trimester   2. Obesity affecting pregnancy in third trimester, unspecified obesity type   3. [redacted] weeks gestation of pregnancy      Plan:   Results reviewed and discussed with patient by  Gigi Gin, CNM.     Otila Kluver, LPN

## 2022-09-20 NOTE — Progress Notes (Unsigned)
    NURSE VISIT NOTE  Subjective:    Patient ID: Robin Hughes, female    DOB: 20-Aug-1985, 37 y.o.   MRN: [id number]  HPI  Patient is a 37 y.o. G55P1001 female who presents for fetal monitoring per order from Jeannie Fend, MD.   Objective:    LMP 02/11/2022  Estimated Date of Delivery: 11/18/22  [redacted]w[redacted]d  Fetus A Non-Stress Test Interpretation for 09/20/22  Indication: Chronic Hypertenstion            Assessment:   1. Maternal chronic hypertension in third trimester   2. [redacted] weeks gestation of pregnancy      Plan:   Results reviewed and discussed with patient by  Jeannie Fend, MD.     Otila Kluver, LPN

## 2022-09-21 ENCOUNTER — Other Ambulatory Visit: Payer: Self-pay

## 2022-09-21 DIAGNOSIS — G809 Cerebral palsy, unspecified: Secondary | ICD-10-CM

## 2022-09-21 DIAGNOSIS — O99213 Obesity complicating pregnancy, third trimester: Secondary | ICD-10-CM

## 2022-09-21 DIAGNOSIS — O10919 Unspecified pre-existing hypertension complicating pregnancy, unspecified trimester: Secondary | ICD-10-CM

## 2022-09-21 DIAGNOSIS — Z3401 Encounter for supervision of normal first pregnancy, first trimester: Secondary | ICD-10-CM

## 2022-09-22 ENCOUNTER — Encounter: Payer: Self-pay | Admitting: Obstetrics and Gynecology

## 2022-09-22 ENCOUNTER — Ambulatory Visit (INDEPENDENT_AMBULATORY_CARE_PROVIDER_SITE_OTHER): Payer: Medicaid Other | Admitting: Obstetrics and Gynecology

## 2022-09-22 ENCOUNTER — Ambulatory Visit (INDEPENDENT_AMBULATORY_CARE_PROVIDER_SITE_OTHER): Payer: Medicaid Other

## 2022-09-22 ENCOUNTER — Observation Stay
Admission: EM | Admit: 2022-09-22 | Discharge: 2022-09-22 | Disposition: A | Discharge status: Home / Self Care | Payer: Medicaid Other | Attending: Obstetrics and Gynecology | Admitting: Obstetrics and Gynecology

## 2022-09-22 ENCOUNTER — Encounter: Payer: Self-pay | Admitting: Advanced Practice Midwife

## 2022-09-22 ENCOUNTER — Other Ambulatory Visit: Payer: Self-pay

## 2022-09-22 VITALS — BP 125/85 | HR 106 | Ht 59.0 in | Wt 158.5 lb

## 2022-09-22 DIAGNOSIS — O10013 Pre-existing essential hypertension complicating pregnancy, third trimester: Secondary | ICD-10-CM

## 2022-09-22 DIAGNOSIS — O368330 Maternal care for abnormalities of the fetal heart rate or rhythm, third trimester, not applicable or unspecified: Principal | ICD-10-CM | POA: Insufficient documentation

## 2022-09-22 DIAGNOSIS — Z3A31 31 weeks gestation of pregnancy: Secondary | ICD-10-CM | POA: Diagnosis not present

## 2022-09-22 DIAGNOSIS — Z3689 Encounter for other specified antenatal screening: Secondary | ICD-10-CM

## 2022-09-22 DIAGNOSIS — O09893 Supervision of other high risk pregnancies, third trimester: Secondary | ICD-10-CM

## 2022-09-22 DIAGNOSIS — Z79899 Other long term (current) drug therapy: Secondary | ICD-10-CM | POA: Diagnosis not present

## 2022-09-22 DIAGNOSIS — O10913 Unspecified pre-existing hypertension complicating pregnancy, third trimester: Secondary | ICD-10-CM | POA: Diagnosis not present

## 2022-09-22 DIAGNOSIS — O09522 Supervision of elderly multigravida, second trimester: Secondary | ICD-10-CM

## 2022-09-22 DIAGNOSIS — O09523 Supervision of elderly multigravida, third trimester: Secondary | ICD-10-CM

## 2022-09-22 MED ORDER — CALCIUM CARBONATE ANTACID 500 MG PO CHEW
400.0000 mg | CHEWABLE_TABLET | Freq: Once | ORAL | Status: AC
  Administered 2022-09-22: 400 mg via ORAL
  Filled 2022-09-22: qty 2

## 2022-09-22 NOTE — OB Triage Note (Signed)
Pt discharged home per order.  Pt stable and ambulatory and an After Visit Summary was printed and given to the patient. Discharge education completed with patient/family including follow up instructions, appointments, and medication list. Pt received labor and bleeding precautions. Patient able to verbalize understanding, all questions fully answered upon discharge. Patient instructed to return to ED, call 911, or call MD for any changes in condition. Pt discharged home via personal vehicle with support person.  ° °

## 2022-09-22 NOTE — OB Triage Note (Signed)
Pt Robin Hughes 37 y.o. presents to labor and delivery triage reporting unable to obtain NST at regular OB appt. Pt is a G2P1001 at [redacted]w[redacted]d. Pt denies signs and symptons consistent with rupture of membranes or active vaginal bleeding. Pt denies contractions and states positive fetal movement. External FM and TOCO applied to non-tender abdomen and assessing. Initial FHR 145. Vital signs obtained and within normal limits. Provider notified of pt.

## 2022-09-22 NOTE — Discharge Summary (Signed)
Physician Final Progress Note  Patient ID: Robin Hughes MRN: [id number] DOB/AGE: 31-Aug-1985 37 y.o.  Admit date: 09/22/2022 Admitting provider: Rod Can, CNM Discharge date: 09/22/2022   Admission Diagnoses:  1) intrauterine pregnancy at [redacted]w[redacted]d  2) inconclusive NST in office  Discharge Diagnoses:  Principal Problem:   Labor and delivery, indication for care Active Problems:   [redacted] weeks gestation of pregnancy   NST (non-stress test) reactive    History of Present Illness: The patient is a 37 y.o. female G2P1001 at [redacted]w[redacted]d who presents from the office for further fetal monitoring due to inconclusive NST in clinic. She is admitted for observation, placed on monitors. She admits good fetal movement and denies vaginal bleeding, leakage of fluid or contractions. She will have weekly NSTs beginning at 32 weeks. Discharged to home.  Past Medical History:  Diagnosis Date   Chest pain, unspecified 04/03/2021   CP (cerebral palsy) ([place])    Hypertension    Indication for care in labor and delivery, antepartum 05/14/2019    Past Surgical History:  Procedure Laterality Date   BACK SURGERY     CESAREAN SECTION  06/29/2019    No current facility-administered medications on file prior to encounter.   Current Outpatient Medications on File Prior to Encounter  Medication Sig Dispense Refill   escitalopram (LEXAPRO) 20 MG tablet Take 20 mg by mouth daily.     ferrous sulfate 325 (65 FE) MG EC tablet Take 1 tablet (325 mg total) by mouth every other day. 30 tablet 3   prenatal vitamin w/FE, FA (PRENATAL 1 + 1) 27-1 MG TABS tablet Take 1 tablet by mouth daily at 12 noon.      No Known Allergies  Social History   Socioeconomic History   Marital status: Single    Spouse name: Not on file   Number of children: Not on file   Years of education: Not on file   Highest education level: Not on file  Occupational History   Not on file  Tobacco Use   Smoking status: Never   Smokeless  tobacco: Never  Vaping Use   Vaping Use: Never used  Substance and Sexual Activity   Alcohol use: No   Drug use: Never   Sexual activity: Not Currently    Birth control/protection: None, Surgical    Comment: tubal  Other Topics Concern   Not on file  Social History Narrative   Not on file   Social Determinants of Health   Financial Resource Strain: Not on file  Food Insecurity: Not on file  Transportation Needs: Not on file  Physical Activity: Not on file  Stress: Not on file  Social Connections: Not on file  Intimate Partner Violence: Not on file    Family History  Problem Relation Age of Onset   COPD Mother    Liver disease Mother    Kidney disease Mother    Atrial fibrillation Mother    Heart attack Father    Heart failure Father    Hypertension Brother    Hypertension Maternal Grandmother    Atrial fibrillation Maternal Grandmother    Kidney disease Maternal Grandmother    Breast cancer Maternal Grandmother    Melanoma Maternal Grandmother      Review of Systems  Constitutional:  Negative for chills and fever.  HENT:  Negative for congestion, ear discharge, ear pain, hearing loss, sinus pain and sore throat.   Eyes:  Negative for blurred vision and double vision.  Respiratory:  Negative  for cough, shortness of breath and wheezing.   Cardiovascular:  Negative for chest pain, palpitations and leg swelling.  Gastrointestinal:  Negative for abdominal pain, blood in stool, constipation, diarrhea, heartburn, melena, nausea and vomiting.  Genitourinary:  Negative for dysuria, flank pain, frequency, hematuria and urgency.  Musculoskeletal:  Negative for back pain, joint pain and myalgias.  Skin:  Negative for itching and rash.  Neurological:  Negative for dizziness, tingling, tremors, sensory change, speech change, focal weakness, seizures, loss of consciousness, weakness and headaches.  Endo/Heme/Allergies:  Negative for environmental allergies. Does not bruise/bleed  easily.  Psychiatric/Behavioral:  Negative for depression, hallucinations, memory loss, substance abuse and suicidal ideas. The patient is not nervous/anxious and does not have insomnia.      Physical Exam: BP 124/69 (BP Location: Left Arm)   Pulse 95   Temp 99.5 F (37.5 C) (Oral)   Ht 4\' 11"  (1.499 m)   Wt 71.7 kg   LMP 02/11/2022   BMI 31.91 kg/m   Constitutional: Well nourished female in no acute distress.  HEENT: normal Skin: Warm and dry.  Cardiovascular: Regular rate and rhythm.   Extremity:  no edema   Respiratory: Clear to auscultation bilateral. Normal respiratory effort Abdomen: FHT present Psych: Alert and Oriented x3. No memory deficits. Normal mood and affect.   Toco: negative Fetal well being: 130 bpm, moderate variability, +accelerations, -decelerations  Consults: None  Significant Findings/ Diagnostic Studies: none  Procedures: NST  Hospital Course: The patient was admitted to Labor and Delivery Triage for observation.   Discharge Condition: good  Disposition: Discharge disposition: 01-Home or Self Care  Diet: Regular diet  Discharge Activity: Activity as tolerated  Discharge Instructions     Discharge activity:  No Restrictions   Complete by: As directed    Discharge diet:  No restrictions   Complete by: As directed       Allergies as of 09/22/2022   No Known Allergies      Medication List     STOP taking these medications    amLODipine 2.5 MG tablet Commonly known as: NORVASC       TAKE these medications    escitalopram 20 MG tablet Commonly known as: LEXAPRO Take 20 mg by mouth daily.   ferrous sulfate 325 (65 FE) MG EC tablet Take 1 tablet (325 mg total) by mouth every other day.   prenatal vitamin w/FE, FA 27-1 MG Tabs tablet Take 1 tablet by mouth daily at 12 noon.         Total time spent taking care of this patient: 21 minutes  Signed: Rod Can, CNM  09/22/2022, 2:55 PM

## 2022-09-22 NOTE — Progress Notes (Signed)
Patient here for her visit, but NST not reactive and difficult tracing mother versus baby. Patient sent to labor and delivery for extended fetal monitoring with concurrent maternal monitoring. Cesarean delivery scheduled for 517-first case. -Not sure if this will be Robin Hughes or Robin Hughes yet.

## 2022-09-23 ENCOUNTER — Encounter: Payer: Self-pay | Admitting: Obstetrics and Gynecology

## 2022-09-26 ENCOUNTER — Other Ambulatory Visit: Payer: Self-pay

## 2022-09-26 DIAGNOSIS — O0930 Supervision of pregnancy with insufficient antenatal care, unspecified trimester: Secondary | ICD-10-CM

## 2022-09-26 DIAGNOSIS — G809 Cerebral palsy, unspecified: Secondary | ICD-10-CM

## 2022-09-26 DIAGNOSIS — O10919 Unspecified pre-existing hypertension complicating pregnancy, unspecified trimester: Secondary | ICD-10-CM

## 2022-09-26 DIAGNOSIS — O34219 Maternal care for unspecified type scar from previous cesarean delivery: Secondary | ICD-10-CM

## 2022-09-26 DIAGNOSIS — O09523 Supervision of elderly multigravida, third trimester: Secondary | ICD-10-CM

## 2022-09-27 ENCOUNTER — Telehealth: Payer: Self-pay | Admitting: Licensed Practical Nurse

## 2022-09-27 NOTE — Telephone Encounter (Signed)
Tried reaching out to patient to have get her scheduled for her 2 week rob. Vm full. We have openings on 10/04/22 with Alma Friendly.

## 2022-09-27 NOTE — Telephone Encounter (Signed)
The patient is scheduled for 4/9 at 1:55 with LMD

## 2022-09-28 ENCOUNTER — Observation Stay
Admission: RE | Admit: 2022-09-28 | Discharge: 2022-09-28 | Disposition: A | Discharge status: Home / Self Care | Payer: Medicaid Other | Attending: Certified Nurse Midwife | Admitting: Certified Nurse Midwife

## 2022-09-28 ENCOUNTER — Other Ambulatory Visit: Payer: Self-pay | Admitting: Maternal & Fetal Medicine

## 2022-09-28 ENCOUNTER — Observation Stay: Payer: Medicaid Other

## 2022-09-28 ENCOUNTER — Other Ambulatory Visit: Payer: Self-pay

## 2022-09-28 ENCOUNTER — Ambulatory Visit: Payer: Medicaid Other

## 2022-09-28 ENCOUNTER — Ambulatory Visit (HOSPITAL_BASED_OUTPATIENT_CLINIC_OR_DEPARTMENT_OTHER): Payer: Medicaid Other

## 2022-09-28 ENCOUNTER — Encounter: Payer: Self-pay | Admitting: Maternal & Fetal Medicine

## 2022-09-28 DIAGNOSIS — G809 Cerebral palsy, unspecified: Secondary | ICD-10-CM

## 2022-09-28 DIAGNOSIS — O10919 Unspecified pre-existing hypertension complicating pregnancy, unspecified trimester: Secondary | ICD-10-CM

## 2022-09-28 DIAGNOSIS — O09523 Supervision of elderly multigravida, third trimester: Secondary | ICD-10-CM

## 2022-09-28 DIAGNOSIS — E669 Obesity, unspecified: Secondary | ICD-10-CM

## 2022-09-28 DIAGNOSIS — Z3A32 32 weeks gestation of pregnancy: Secondary | ICD-10-CM | POA: Insufficient documentation

## 2022-09-28 DIAGNOSIS — O99353 Diseases of the nervous system complicating pregnancy, third trimester: Secondary | ICD-10-CM | POA: Insufficient documentation

## 2022-09-28 DIAGNOSIS — O34219 Maternal care for unspecified type scar from previous cesarean delivery: Secondary | ICD-10-CM

## 2022-09-28 DIAGNOSIS — O0933 Supervision of pregnancy with insufficient antenatal care, third trimester: Secondary | ICD-10-CM | POA: Insufficient documentation

## 2022-09-28 DIAGNOSIS — O0930 Supervision of pregnancy with insufficient antenatal care, unspecified trimester: Secondary | ICD-10-CM

## 2022-09-28 DIAGNOSIS — O10913 Unspecified pre-existing hypertension complicating pregnancy, third trimester: Secondary | ICD-10-CM | POA: Insufficient documentation

## 2022-09-28 DIAGNOSIS — O99213 Obesity complicating pregnancy, third trimester: Secondary | ICD-10-CM

## 2022-09-28 DIAGNOSIS — O368330 Maternal care for abnormalities of the fetal heart rate or rhythm, third trimester, not applicable or unspecified: Secondary | ICD-10-CM | POA: Diagnosis not present

## 2022-09-28 DIAGNOSIS — O10013 Pre-existing essential hypertension complicating pregnancy, third trimester: Secondary | ICD-10-CM | POA: Diagnosis not present

## 2022-09-28 MED ORDER — LACTATED RINGERS IV SOLN: 500.0000 mL | INTRAVENOUS | Status: DC | PRN

## 2022-09-28 MED ORDER — LACTATED RINGERS IV SOLN: INTRAVENOUS | Status: DC

## 2022-09-28 NOTE — Procedures (Addendum)
Robin Hughes 1985/12/30 [redacted]w[redacted]d  Fetus A Non-Stress Test Interpretation for 09/28/22  Indication: Chronic Hypertenstion,AMA, CP, LETC, Hx:C-Sec  Fetal Heart Rate A Mode: External Baseline Rate (A): 130 bpm Variability: Moderate Decelerations: None Multiple birth?: No  Uterine Activity Mode: Toco Non-Reactive NST-Dr. Epimenio Sarin interpreted.    [facility]ed [facility] for room assignment at 12:15. Spoke to Briny Breezes on the phone-given room OBS 3. Pt transported to [facility] @ 12:26 via wheelchair.  Dropped off report with Claiborne Billings at the desk prior to leaving.  She was calling staff for additional assistance.  Instructed to call us back if any questions or concerns. Pt safe in OBS 3 with her nurse call bell in hand with instructions on use if needed. (Pt had a Non -Reactive NST that she just needed further monitoring for).

## 2022-09-28 NOTE — OB Triage Note (Signed)
Sent by MFM for monitoring. Pt. States she has not eaten or drank anything today other than water. Denies pain, bleeding or LOF. EFMs applied.

## 2022-09-28 NOTE — OB Triage Note (Signed)
  L&D OB Triage Note  SUBJECTIVE Robin Hughes is a 37 y.o. G67P1001 female at [redacted]w[redacted]d, EDD Estimated Date of Delivery: 11/18/22 who presented to triage from MFM appointment for prolonged monitoring due to non reactive NST and BPP 6/10.   OB History  Gravida Para Term Preterm AB Living  2 1 1  0 0 1  SAB IAB Ectopic Multiple Live Births  0 0 0 0 1    # Outcome Date GA Lbr Len/2nd Weight Sex Delivery Anes PTL Lv  2 Current           1 Term 06/29/19   3204 g M CS-Unspec   LIV     Complications: Failure to Progress in First Stage    Medications Prior to Admission  Medication Sig Dispense Refill Last Dose   escitalopram (LEXAPRO) 20 MG tablet Take 20 mg by mouth daily.   09/27/2022   ferrous sulfate 325 (65 FE) MG EC tablet Take 1 tablet (325 mg total) by mouth every other day. 30 tablet 3 09/27/2022   prenatal vitamin w/FE, FA (PRENATAL 1 + 1) 27-1 MG TABS tablet Take 1 tablet by mouth daily at 12 noon.   09/27/2022     OBJECTIVE  Nursing Evaluation:   Resp 16   LMP 02/11/2022    Findings:   Reactive NST.       NST was performed and has been reviewed by me.  NST INTERPRETATION: Category I  Mode: External Baseline Rate (A): 130 bpm Variability: Moderate Accelerations: 15 x 15 Decelerations: None     Contraction Frequency (min): none  ASSESSMENT Impression:  1.  Pregnancy:  G2P1001 at [redacted]w[redacted]d , EDD Estimated Date of Delivery: 11/18/22 2.  Reassuring fetal and maternal status 3.  Twice weekly NST per MFM recommendations   PLAN 1. Current condition and above findings reviewed.  Reassuring fetal and maternal condition. 2. Discharge home with standard labor precautions given to return to L&D or call the office for problems. Follow up as scheduled in office tomorrow. NST on Monday.  3. Continue routine prenatal care. Dr.Evans consulted on plan of care  Philip Aspen, CNM

## 2022-09-29 ENCOUNTER — Encounter: Payer: Medicaid Other | Admitting: Obstetrics and Gynecology

## 2022-09-29 DIAGNOSIS — O09893 Supervision of other high risk pregnancies, third trimester: Secondary | ICD-10-CM

## 2022-09-29 DIAGNOSIS — Z3A32 32 weeks gestation of pregnancy: Secondary | ICD-10-CM

## 2022-09-30 ENCOUNTER — Telehealth: Payer: Self-pay

## 2022-09-30 NOTE — Telephone Encounter (Signed)
Pt calling; could not understand what she needed; sounded like she was wanting a rx for something.  [phone]  Mailbox is full.

## 2022-10-03 ENCOUNTER — Other Ambulatory Visit: Payer: Self-pay

## 2022-10-03 DIAGNOSIS — O10919 Unspecified pre-existing hypertension complicating pregnancy, unspecified trimester: Secondary | ICD-10-CM

## 2022-10-03 DIAGNOSIS — G809 Cerebral palsy, unspecified: Secondary | ICD-10-CM

## 2022-10-03 DIAGNOSIS — O0930 Supervision of pregnancy with insufficient antenatal care, unspecified trimester: Secondary | ICD-10-CM

## 2022-10-03 DIAGNOSIS — O09523 Supervision of elderly multigravida, third trimester: Secondary | ICD-10-CM

## 2022-10-03 DIAGNOSIS — O34219 Maternal care for unspecified type scar from previous cesarean delivery: Secondary | ICD-10-CM

## 2022-10-04 ENCOUNTER — Telehealth (INDEPENDENT_AMBULATORY_CARE_PROVIDER_SITE_OTHER): Payer: Medicaid Other | Admitting: Licensed Practical Nurse

## 2022-10-04 ENCOUNTER — Other Ambulatory Visit: Payer: Medicaid Other

## 2022-10-04 VITALS — Ht 59.0 in

## 2022-10-04 DIAGNOSIS — O0990 Supervision of high risk pregnancy, unspecified, unspecified trimester: Secondary | ICD-10-CM

## 2022-10-04 DIAGNOSIS — Z3A33 33 weeks gestation of pregnancy: Secondary | ICD-10-CM | POA: Diagnosis not present

## 2022-10-04 DIAGNOSIS — O0993 Supervision of high risk pregnancy, unspecified, third trimester: Secondary | ICD-10-CM | POA: Diagnosis not present

## 2022-10-04 NOTE — Progress Notes (Signed)
Virtual Visit via Video Note  I connected with Robin Hughes on 10/04/22 at  1:55 PM EDT by video and verified that I am speaking with the correct person using two identifiers.  Location: Patient: located at her home in [place], [place] Provider: located in the office in [place], [place]   I discussed the limitations, risks, security and privacy concerns of performing an evaluation and management service by telephone and the availability of in person appointments. I also discussed with the patient that there may be a patient responsible charge related to this service. The patient expressed understanding and agreed to proceed.   History of Present Illness: Pt unable to come into the office for her ROB, was able to convert visit to virtual visit.   Pt tearful, has been dealing with sadness related her her partner ending their relationship in the beginning of this pregnancy. Her mother is available for support. Denies SI/HI. Is currently on medication for Depression. Reports that she cries a lot  her appetite has been decreased, reviewed this could be normal d/t third trimester changes  but could be related to her mood as well. She does eat 1-3 times a day. She has been feeling B-H contractions. Her aunt will watch her child while she is in the hospital. Her mother wil be her support person. She is looking forward to may 17, she is excited to meet this baby. Reviewed signs of labor/reasons to come to the hospital.    Observations/Objective: Pt tearful for most of visit   Assessment and Plan: IUP at 33 wks -[place] with MFM later this week -RTC in 1 week, will start weekly testing  -Encouraged pt to reach out to her provider that manages her depression and discuss possible dose increase or other treatments   Follow Up Instructions:    I discussed the assessment and treatment plan with the patient. The patient was provided an opportunity to ask questions and all were answered. The patient agreed  with the plan and demonstrated an understanding of the instructions.   The patient was advised to call back or seek an in-person evaluation if the symptoms worsen or if the condition fails to improve as anticipated.  I provided 20 minutes of non-face-to-face time during this encounter.   Ellouise Newer Mirah Nevins, CNM

## 2022-10-04 NOTE — Telephone Encounter (Signed)
Pt has appt today c LMD.

## 2022-10-05 ENCOUNTER — Ambulatory Visit: Payer: Medicaid Other | Attending: Maternal & Fetal Medicine

## 2022-10-05 ENCOUNTER — Other Ambulatory Visit: Payer: Self-pay

## 2022-10-05 DIAGNOSIS — G809 Cerebral palsy, unspecified: Secondary | ICD-10-CM | POA: Insufficient documentation

## 2022-10-05 DIAGNOSIS — O09523 Supervision of elderly multigravida, third trimester: Secondary | ICD-10-CM | POA: Diagnosis not present

## 2022-10-05 DIAGNOSIS — O99213 Obesity complicating pregnancy, third trimester: Secondary | ICD-10-CM | POA: Insufficient documentation

## 2022-10-05 DIAGNOSIS — E669 Obesity, unspecified: Secondary | ICD-10-CM

## 2022-10-05 DIAGNOSIS — O34219 Maternal care for unspecified type scar from previous cesarean delivery: Secondary | ICD-10-CM

## 2022-10-05 DIAGNOSIS — O10013 Pre-existing essential hypertension complicating pregnancy, third trimester: Secondary | ICD-10-CM | POA: Diagnosis not present

## 2022-10-05 DIAGNOSIS — O0933 Supervision of pregnancy with insufficient antenatal care, third trimester: Secondary | ICD-10-CM | POA: Diagnosis not present

## 2022-10-05 DIAGNOSIS — O10919 Unspecified pre-existing hypertension complicating pregnancy, unspecified trimester: Secondary | ICD-10-CM

## 2022-10-05 DIAGNOSIS — O99353 Diseases of the nervous system complicating pregnancy, third trimester: Secondary | ICD-10-CM | POA: Diagnosis not present

## 2022-10-05 DIAGNOSIS — Z3A33 33 weeks gestation of pregnancy: Secondary | ICD-10-CM | POA: Insufficient documentation

## 2022-10-05 DIAGNOSIS — O0930 Supervision of pregnancy with insufficient antenatal care, unspecified trimester: Secondary | ICD-10-CM

## 2022-10-05 DIAGNOSIS — O10913 Unspecified pre-existing hypertension complicating pregnancy, third trimester: Secondary | ICD-10-CM | POA: Diagnosis present

## 2022-10-07 ENCOUNTER — Encounter: Payer: Self-pay | Admitting: Obstetrics and Gynecology

## 2022-10-10 ENCOUNTER — Other Ambulatory Visit: Payer: Self-pay

## 2022-10-10 ENCOUNTER — Encounter: Payer: Self-pay | Admitting: Obstetrics and Gynecology

## 2022-10-10 ENCOUNTER — Ambulatory Visit (INDEPENDENT_AMBULATORY_CARE_PROVIDER_SITE_OTHER): Payer: Medicaid Other | Admitting: Obstetrics and Gynecology

## 2022-10-10 VITALS — BP 121/81 | HR 121 | Wt 175.7 lb

## 2022-10-10 DIAGNOSIS — O0930 Supervision of pregnancy with insufficient antenatal care, unspecified trimester: Secondary | ICD-10-CM

## 2022-10-10 DIAGNOSIS — O10919 Unspecified pre-existing hypertension complicating pregnancy, unspecified trimester: Secondary | ICD-10-CM

## 2022-10-10 DIAGNOSIS — Z3A34 34 weeks gestation of pregnancy: Secondary | ICD-10-CM

## 2022-10-10 DIAGNOSIS — O09893 Supervision of other high risk pregnancies, third trimester: Secondary | ICD-10-CM

## 2022-10-10 DIAGNOSIS — O34219 Maternal care for unspecified type scar from previous cesarean delivery: Secondary | ICD-10-CM

## 2022-10-10 DIAGNOSIS — G809 Cerebral palsy, unspecified: Secondary | ICD-10-CM

## 2022-10-10 DIAGNOSIS — O09523 Supervision of elderly multigravida, third trimester: Secondary | ICD-10-CM

## 2022-10-10 LAB — POCT URINALYSIS DIPSTICK OB
Bilirubin, UA: NEGATIVE
Ketones, UA: NEGATIVE
Leukocytes, UA: NEGATIVE
Spec Grav, UA: 1.015 (ref 1.010–1.025)
Urobilinogen, UA: 0.2 E.U./dL
pH, UA: 6 (ref 5.0–8.0)

## 2022-10-10 NOTE — Progress Notes (Signed)
ROB: Reports the baby moving well.  Patient states she feels uncomfortable and wants to move her C-section up to 38 weeks-declined.  Having some pelvic pressure.  Small amount of blood in urine.  Cultures sent will contact her if she has a UTI confirmed by culture and will send in appropriate antibiotics.  Complains of some heartburn.  Prilosec OTC discussed.

## 2022-10-10 NOTE — Addendum Note (Signed)
Addended by: Loman Chroman on: 10/10/2022 03:46 PM   Modules accepted: Orders

## 2022-10-10 NOTE — Progress Notes (Unsigned)
MFM

## 2022-10-10 NOTE — Progress Notes (Signed)
ROB. Patient states daily fetal movement and pelvic discomfort. She states she would like to discuss moving her scheduled date for her cesarean, states she is hardly able to get around now. BPP scheduled for 4/17, patient is aware.

## 2022-10-12 ENCOUNTER — Other Ambulatory Visit: Payer: Medicaid Other

## 2022-10-12 LAB — URINE CULTURE

## 2022-10-14 ENCOUNTER — Telehealth: Payer: Self-pay | Admitting: Obstetrics

## 2022-10-14 NOTE — Telephone Encounter (Signed)
Called to tell the patient to disreguard appointment with Guadlupe Spanish on 10/19/2022 at 10:35, she has an [place] appointment at 10:35 Called to tell the patient to disreguard appointment with Guadlupe Spanish on 10/19/2022 at 10:35, she has an [place] appointment at 10:00am

## 2022-10-17 ENCOUNTER — Telehealth: Payer: Self-pay

## 2022-10-17 ENCOUNTER — Other Ambulatory Visit: Payer: Self-pay

## 2022-10-17 ENCOUNTER — Observation Stay
Admission: EM | Admit: 2022-10-17 | Discharge: 2022-10-18 | Disposition: A | Discharge status: Home / Self Care | Payer: Medicaid Other | Attending: Family | Admitting: Family

## 2022-10-17 DIAGNOSIS — R2243 Localized swelling, mass and lump, lower limb, bilateral: Secondary | ICD-10-CM | POA: Insufficient documentation

## 2022-10-17 DIAGNOSIS — G809 Cerebral palsy, unspecified: Secondary | ICD-10-CM

## 2022-10-17 DIAGNOSIS — O34219 Maternal care for unspecified type scar from previous cesarean delivery: Secondary | ICD-10-CM

## 2022-10-17 DIAGNOSIS — D649 Anemia, unspecified: Secondary | ICD-10-CM | POA: Insufficient documentation

## 2022-10-17 DIAGNOSIS — O26893 Other specified pregnancy related conditions, third trimester: Secondary | ICD-10-CM | POA: Diagnosis present

## 2022-10-17 DIAGNOSIS — Z3A35 35 weeks gestation of pregnancy: Secondary | ICD-10-CM | POA: Diagnosis not present

## 2022-10-17 DIAGNOSIS — O10919 Unspecified pre-existing hypertension complicating pregnancy, unspecified trimester: Secondary | ICD-10-CM

## 2022-10-17 DIAGNOSIS — R600 Localized edema: Secondary | ICD-10-CM | POA: Diagnosis present

## 2022-10-17 DIAGNOSIS — O163 Unspecified maternal hypertension, third trimester: Secondary | ICD-10-CM | POA: Insufficient documentation

## 2022-10-17 DIAGNOSIS — O0930 Supervision of pregnancy with insufficient antenatal care, unspecified trimester: Secondary | ICD-10-CM

## 2022-10-17 NOTE — Telephone Encounter (Signed)
Robin Hughes called in stating she's getting light headed really bad, legs are swelling up really bad. I advised her to go to ED patient didn't like that option. Just kept saying please write this down for the doctor. I advised her to elevate her feet above her heart. If swelling doesn't go down within the next hour or so to please go to ED.

## 2022-10-18 ENCOUNTER — Encounter: Payer: Self-pay | Admitting: Obstetrics and Gynecology

## 2022-10-18 DIAGNOSIS — G809 Cerebral palsy, unspecified: Secondary | ICD-10-CM | POA: Diagnosis not present

## 2022-10-18 DIAGNOSIS — O1203 Gestational edema, third trimester: Secondary | ICD-10-CM

## 2022-10-18 DIAGNOSIS — O99013 Anemia complicating pregnancy, third trimester: Secondary | ICD-10-CM

## 2022-10-18 DIAGNOSIS — D649 Anemia, unspecified: Secondary | ICD-10-CM

## 2022-10-18 DIAGNOSIS — O99353 Diseases of the nervous system complicating pregnancy, third trimester: Secondary | ICD-10-CM | POA: Diagnosis not present

## 2022-10-18 DIAGNOSIS — R600 Localized edema: Secondary | ICD-10-CM | POA: Diagnosis present

## 2022-10-18 DIAGNOSIS — Z3A35 35 weeks gestation of pregnancy: Secondary | ICD-10-CM

## 2022-10-18 LAB — COMPREHENSIVE METABOLIC PANEL
ALT: 15 U/L (ref 0–44)
AST: 21 U/L (ref 15–41)
Albumin: 2.5 g/dL — ABNORMAL LOW (ref 3.5–5.0)
Alkaline Phosphatase: 113 U/L (ref 38–126)
Anion gap: 7 (ref 5–15)
BUN: 7 mg/dL (ref 6–20)
CO2: 21 mmol/L — ABNORMAL LOW (ref 22–32)
Calcium: 8.7 mg/dL — ABNORMAL LOW (ref 8.9–10.3)
Chloride: 106 mmol/L (ref 98–111)
Creatinine, Ser: 0.51 mg/dL (ref 0.44–1.00)
GFR, Estimated: >60 mL/min (ref >60)
Glucose, Bld: 104 mg/dL — ABNORMAL HIGH (ref 70–99)
Potassium: 3.6 mmol/L (ref 3.5–5.1)
Sodium: 134 mmol/L — ABNORMAL LOW (ref 135–145)
Total Bilirubin: 0.4 mg/dL (ref 0.3–1.2)
Total Protein: 6.5 g/dL (ref 6.5–8.1)

## 2022-10-18 LAB — PROTEIN / CREATININE RATIO, URINE
Creatinine, Urine: 46 mg/dL
Protein Creatinine Ratio: 0.24 mg/mg{Cre} — ABNORMAL HIGH (ref 0.00–0.15)
Total Protein, Urine: 11 mg/dL

## 2022-10-18 LAB — CBC WITH DIFFERENTIAL/PLATELET
Abs Immature Granulocytes: 0.07 10*3/uL (ref 0.00–0.07)
Basophils Absolute: 0 10*3/uL (ref 0.0–0.1)
Basophils Relative: 0 %
Eosinophils Absolute: 0.2 10*3/uL (ref 0.0–0.5)
Eosinophils Relative: 2 %
HCT: 27.6 % — ABNORMAL LOW (ref 36.0–46.0)
Hemoglobin: 8.6 g/dL — ABNORMAL LOW (ref 12.0–15.0)
Immature Granulocytes: 1 %
Lymphocytes Relative: 18 %
Lymphs Abs: 1.9 10*3/uL (ref 0.7–4.0)
MCH: 23.7 pg — ABNORMAL LOW (ref 26.0–34.0)
MCHC: 31.2 g/dL (ref 30.0–36.0)
MCV: 76 fL — ABNORMAL LOW (ref 80.0–100.0)
Monocytes Absolute: 1 10*3/uL (ref 0.1–1.0)
Monocytes Relative: 9 %
Neutro Abs: 7.7 10*3/uL (ref 1.7–7.7)
Neutrophils Relative %: 70 %
Platelets: 263 10*3/uL (ref 150–400)
RBC: 3.63 MIL/uL — ABNORMAL LOW (ref 3.87–5.11)
RDW: 15 % (ref 11.5–15.5)
WBC: 10.9 10*3/uL — ABNORMAL HIGH (ref 4.0–10.5)
nRBC: 0.2 % (ref 0.0–0.2)

## 2022-10-18 MED ORDER — SODIUM CHLORIDE 0.9 % IV SOLN
300.0000 mg | Freq: Once | INTRAVENOUS | Status: AC
  Administered 2022-10-18: 300 mg via INTRAVENOUS
  Filled 2022-10-18: qty 15

## 2022-10-18 NOTE — OB Triage Note (Signed)
Pt is G2P1 at 35.4w with c/o leg swelling x 2 days and incontinence of bladder and bowels at times x a"a few weeks". Pt has cerebral palsy and requires a w/c and mod assistance with all task. She says tonight she was just sick of it and wants to make sure she doesn't "have PIH again" Pt reports + FM FHR 135

## 2022-10-18 NOTE — Progress Notes (Signed)
Pt received discharge information. Awaiting ride and will call out when they are here.

## 2022-10-18 NOTE — Progress Notes (Signed)
Pt picked up by [employer] [facility]- pt assisted to taxi by RN. Pt's mother waiting to receive pt at home. Pt aware of plan.

## 2022-10-18 NOTE — Final Progress Note (Signed)
OB/Triage Note  Patient ID: EDDA OREA MRN: [id number] DOB/AGE: [birth year] 37 y.o.  Subjective  History of Present Illness: The patient is a 37 y.o. female G2P1001 at [redacted]w[redacted]d who presents for lower edema swelling which started yesterday morning. She is concerned she may have preeclampsia due to swelling. She is a wheelchair user due to CP. She denies HA, visual changes, RUQ pain, VB, LOF, contractions. Endorses FM.   Past Medical History:  Diagnosis Date   Chest pain, unspecified 04/03/2021   CP (cerebral palsy)    Hypertension    Indication for care in labor and delivery, antepartum 05/14/2019    Past Surgical History:  Procedure Laterality Date   BACK SURGERY     CESAREAN SECTION  06/29/2019    No current facility-administered medications on file prior to encounter.   Current Outpatient Medications on File Prior to Encounter  Medication Sig Dispense Refill   escitalopram (LEXAPRO) 20 MG tablet Take 20 mg by mouth daily.     ferrous sulfate 325 (65 FE) MG EC tablet Take 1 tablet (325 mg total) by mouth every other day. 30 tablet 3   prenatal vitamin w/FE, FA (PRENATAL 1 + 1) 27-1 MG TABS tablet Take 1 tablet by mouth daily at 12 noon.      No Known Allergies  Social History   Socioeconomic History   Marital status: Single    Spouse name: Not on file   Number of children: Not on file   Years of education: Not on file   Highest education level: Not on file  Occupational History   Not on file  Tobacco Use   Smoking status: Never   Smokeless tobacco: Never  Vaping Use   Vaping Use: Never used  Substance and Sexual Activity   Alcohol use: No   Drug use: Never   Sexual activity: Not Currently    Birth control/protection: None, Surgical    Comment: tubal  Other Topics Concern   Not on file  Social History Narrative   Not on file   Social Determinants of Health   Financial Resource Strain: Not on file  Food Insecurity: Not on file  Transportation  Needs: Not on file  Physical Activity: Not on file  Stress: Not on file  Social Connections: Not on file  Intimate Partner Violence: Not on file    Family History  Problem Relation Age of Onset   COPD Mother    Liver disease Mother    Kidney disease Mother    Atrial fibrillation Mother    Heart attack Father    Heart failure Father    Hypertension Brother    Hypertension Maternal Grandmother    Atrial fibrillation Maternal Grandmother    Kidney disease Maternal Grandmother    Breast cancer Maternal Grandmother    Melanoma Maternal Grandmother      ROS    Objective  Physical Exam: BP 132/89   Pulse 93   Temp 98.1 F (36.7 C) (Oral)   Resp 16   LMP 02/11/2022   OBGyn Exam  FHT 140, mod variability, pos accels, no decels Toco some contractions, patient not feeling them  Lower extremities with +2 pitting edema bilaterally  Significant Findings/ Diagnostic Studies:  PC ratio 0.24, CMP WNL, CBC showing anemia with hgb 8.6 but otherwise [facility]   Hospital Course: The patient was admitted to [facility] Triage for observation. Had reactive NST. BP was WNL although patient did not feel reassured. Did offer to do preeclampsia labs. Labs  negative for preeclampsia but did show significant and worsening anemia. She had a scheduled repeat c/s in several weeks. Recommended iron infusion which she accepted, was given and tolerated.   Assessment: 37 y.o. female G2P1001 at [redacted]w[redacted]d  Lower extremity edema anemia  Plan: Discharge home Follow up at next ROB Teaching: preeclampsia s/sx  Discharge Instructions     Discharge activity:  No Restrictions   Complete by: As directed    Discharge diet:  No restrictions   Complete by: As directed    Discharge instructions   Complete by: As directed    Keep next ROB      Allergies as of 10/18/2022   No Known Allergies      Medication List     TAKE these medications    escitalopram 20 MG tablet Commonly known as: LEXAPRO Take 20 mg  by mouth daily.   ferrous sulfate 325 (65 FE) MG EC tablet Take 1 tablet (325 mg total) by mouth every other day.   prenatal vitamin w/FE, FA 27-1 MG Tabs tablet Take 1 tablet by mouth daily at 12 noon.         Total time spent taking care of this patient: 75 minutes  Signed: Raeford Razor CNM, FNP 10/18/2022, 5:58 AM

## 2022-10-19 ENCOUNTER — Observation Stay
Admission: RE | Admit: 2022-10-19 | Discharge: 2022-10-19 | Disposition: A | Discharge status: Home / Self Care | Payer: Medicaid Other | Attending: Licensed Practical Nurse | Admitting: Licensed Practical Nurse

## 2022-10-19 ENCOUNTER — Ambulatory Visit (HOSPITAL_BASED_OUTPATIENT_CLINIC_OR_DEPARTMENT_OTHER): Payer: Medicaid Other

## 2022-10-19 ENCOUNTER — Other Ambulatory Visit: Payer: Self-pay

## 2022-10-19 ENCOUNTER — Encounter: Payer: Self-pay | Admitting: Obstetrics and Gynecology

## 2022-10-19 DIAGNOSIS — O0930 Supervision of pregnancy with insufficient antenatal care, unspecified trimester: Secondary | ICD-10-CM | POA: Insufficient documentation

## 2022-10-19 DIAGNOSIS — O10013 Pre-existing essential hypertension complicating pregnancy, third trimester: Secondary | ICD-10-CM

## 2022-10-19 DIAGNOSIS — O26893 Other specified pregnancy related conditions, third trimester: Secondary | ICD-10-CM | POA: Insufficient documentation

## 2022-10-19 DIAGNOSIS — O0933 Supervision of pregnancy with insufficient antenatal care, third trimester: Secondary | ICD-10-CM | POA: Insufficient documentation

## 2022-10-19 DIAGNOSIS — O09523 Supervision of elderly multigravida, third trimester: Secondary | ICD-10-CM | POA: Insufficient documentation

## 2022-10-19 DIAGNOSIS — G809 Cerebral palsy, unspecified: Secondary | ICD-10-CM | POA: Insufficient documentation

## 2022-10-19 DIAGNOSIS — O9A213 Injury, poisoning and certain other consequences of external causes complicating pregnancy, third trimester: Secondary | ICD-10-CM

## 2022-10-19 DIAGNOSIS — O34219 Maternal care for unspecified type scar from previous cesarean delivery: Secondary | ICD-10-CM | POA: Insufficient documentation

## 2022-10-19 DIAGNOSIS — W01198A Fall on same level from slipping, tripping and stumbling with subsequent striking against other object, initial encounter: Secondary | ICD-10-CM | POA: Insufficient documentation

## 2022-10-19 DIAGNOSIS — W19XXXD Unspecified fall, subsequent encounter: Secondary | ICD-10-CM | POA: Diagnosis not present

## 2022-10-19 DIAGNOSIS — W19XXXA Unspecified fall, initial encounter: Secondary | ICD-10-CM

## 2022-10-19 DIAGNOSIS — O163 Unspecified maternal hypertension, third trimester: Secondary | ICD-10-CM | POA: Insufficient documentation

## 2022-10-19 DIAGNOSIS — R109 Unspecified abdominal pain: Secondary | ICD-10-CM

## 2022-10-19 DIAGNOSIS — O99213 Obesity complicating pregnancy, third trimester: Secondary | ICD-10-CM | POA: Insufficient documentation

## 2022-10-19 DIAGNOSIS — O10913 Unspecified pre-existing hypertension complicating pregnancy, third trimester: Secondary | ICD-10-CM | POA: Insufficient documentation

## 2022-10-19 DIAGNOSIS — O10919 Unspecified pre-existing hypertension complicating pregnancy, unspecified trimester: Secondary | ICD-10-CM | POA: Insufficient documentation

## 2022-10-19 DIAGNOSIS — Z3A35 35 weeks gestation of pregnancy: Secondary | ICD-10-CM | POA: Insufficient documentation

## 2022-10-19 DIAGNOSIS — O99353 Diseases of the nervous system complicating pregnancy, third trimester: Secondary | ICD-10-CM | POA: Diagnosis not present

## 2022-10-19 DIAGNOSIS — E669 Obesity, unspecified: Secondary | ICD-10-CM

## 2022-10-19 LAB — RUPTURE OF MEMBRANE (ROM)PLUS: Rom Plus: NEGATIVE

## 2022-10-19 LAB — GROUP B STREP BY PCR: Group B strep by PCR: NEGATIVE

## 2022-10-19 MED ORDER — ACETAMINOPHEN 500 MG PO TABS
1000.0000 mg | ORAL_TABLET | Freq: Four times a day (QID) | ORAL | Status: DC | PRN
  Administered 2022-10-19: 500 mg via ORAL

## 2022-10-19 MED ORDER — ACETAMINOPHEN 500 MG PO TABS
ORAL_TABLET | ORAL | Status: AC
  Filled 2022-10-19: qty 2

## 2022-10-19 MED ORDER — LACTATED RINGERS IV BOLUS
1000.0000 mL | Freq: Once | INTRAVENOUS | Status: AC
  Administered 2022-10-19: 1000 mL via INTRAVENOUS

## 2022-10-19 NOTE — Progress Notes (Unsigned)
Pt had a fall this morning outside of the [facility] door on her way into her MFM appointment today @ 10:00.  Dr. Grace Bushy notified.  Safety Zone Portal completed.  Ultrasound performed as scheduled.  MFM request pt be monitored in [facility] prior to being sent home because of her accidental fall this morning.  Report called and given to Cain Sieve, RN prior to pt transport via wheelchair  to OBS4 by Janean Sark, CNA.

## 2022-10-19 NOTE — OB Triage Note (Signed)
Patient was sent for evaluation and extended monitoring from the maternal fetal monitoring clinic. Patient had a fall this morning getting into the car.

## 2022-10-19 NOTE — Discharge Summary (Signed)
Physician Final Progress Note  Patient ID: Robin Hughes MRN: [id number] DOB/AGE: [birth year] 37 y.o.  Admit date: 10/19/2022 Admitting provider: Linzie Collin, MD Discharge date: 10/19/2022   Admission Diagnoses:  1) intrauterine pregnancy at [redacted]w[redacted]d  2) s/p fall at 1000  Discharge Diagnoses:  Active Problems:   Indication for care in labor and delivery, antepartum    History of Present Illness: The patient is a 37 y.o. female G2P1001 at [redacted]w[redacted]d who presents for evaluation after a fall at 1000. Robin Hughes reports she fell while getting out of her car into the wheelchair on her way to her MFM apportionment, her father reports that she lost her balance, he caught her and he gently guided her to the ground. [name] reports that she  hit her right side, she did not directly hit her abdomen. She has been noticing tightening in her abdomen since last night. The tightening has increased in intensity over the last few hours. She was seen by MFM, had a normal [place] and then was instructed to come to triage for monitoring d/t the tightening she was feeling. She endorses +FM. Denies any vaginal bleeding. Last night on her way to the BR she felt wet, she thinks that she lost urine, she has not noticed any leakage since.   Past Medical History:  Diagnosis Date   Chest pain, unspecified 04/03/2021   CP (cerebral palsy)    Hypertension    Indication for care in labor and delivery, antepartum 05/14/2019    Past Surgical History:  Procedure Laterality Date   BACK SURGERY     CESAREAN SECTION  06/29/2019    No current facility-administered medications on file prior to encounter.   Current Outpatient Medications on File Prior to Encounter  Medication Sig Dispense Refill   escitalopram (LEXAPRO) 20 MG tablet Take 20 mg by mouth daily.     ferrous sulfate 325 (65 FE) MG EC tablet Take 1 tablet (325 mg total) by mouth every other day. 30 tablet 3   prenatal vitamin w/FE, FA (PRENATAL 1 + 1) 27-1 MG  TABS tablet Take 1 tablet by mouth daily at 12 noon.      No Known Allergies  Social History   Socioeconomic History   Marital status: Single    Spouse name: Not on file   Number of children: Not on file   Years of education: Not on file   Highest education level: Not on file  Occupational History   Not on file  Tobacco Use   Smoking status: Never   Smokeless tobacco: Never  Vaping Use   Vaping Use: Never used  Substance and Sexual Activity   Alcohol use: No   Drug use: Never   Sexual activity: Not Currently    Birth control/protection: None, Surgical    Comment: tubal  Other Topics Concern   Not on file  Social History Narrative   Not on file   Social Determinants of Health   Financial Resource Strain: Not on file  Food Insecurity: Not on file  Transportation Needs: Not on file  Physical Activity: Not on file  Stress: Not on file  Social Connections: Not on file  Intimate Partner Violence: Not on file    Family History  Problem Relation Age of Onset   COPD Mother    Liver disease Mother    Kidney disease Mother    Atrial fibrillation Mother    Heart attack Father    Heart failure Father    Hypertension Brother  Hypertension Maternal Grandmother    Atrial fibrillation Maternal Grandmother    Kidney disease Maternal Grandmother    Breast cancer Maternal Grandmother    Melanoma Maternal Grandmother      ROS see HPI   Physical Exam: BP 130/81 (BP Location: Right Arm)   Pulse (!) 110   Temp 98.2 F (36.8 C) (Oral)   Resp 18   LMP 02/11/2022   Physical Exam Constitutional:      Appearance: Normal appearance.  Genitourinary:     Vulva normal.  Pulmonary:     Effort: Pulmonary effort is normal.  Abdominal:     Tenderness: There is no abdominal tenderness.     Comments: gravid  Musculoskeletal:     Cervical back: Normal range of motion and neck supple.     Right lower leg: No edema.     Left lower leg: No edema.  Neurological:     General:  No focal deficit present.     Mental Status: She is alert.  Psychiatric:        Mood and Affect: Mood normal.   EFM: Baseline 125, moderate variability, pos accel, neg decel  Toco: q 2-5  Consults: None  Significant Findings/ Diagnostic Studies: ROM plus negative   Procedures: RNST  Hospital Course: The patient was admitted to Labor and Delivery Triage for observation. She was given a 1 liter LR bolus. She was able to take a short nap. Her ROM plus was negative. Contractions continued at about the same rate. Her Cervical exam remained unchanged. Her Korea at MFM  showed no evidence of an abruption and a BPP 8/8. Given low suspicion for abruption and she is not in labor pt was discharged home. Reviewed reasons to return. Pt understands that if she were found to be in labor priro to her scheduled c/s on May 17, she would have her c-section at the time labor is declared. Pt expressed a desired to have a bilateral tubal ligation. Consent signed and taken up front.   Discharge Condition: good  Disposition:  Discharge disposition: 01-Home or Self Care       Diet: Regular diet  Discharge Activity: Activity as tolerated   Allergies as of 10/19/2022   No Known Allergies      Medication List     TAKE these medications    escitalopram 20 MG tablet Commonly known as: LEXAPRO Take 20 mg by mouth daily.   ferrous sulfate 325 (65 FE) MG EC tablet Take 1 tablet (325 mg total) by mouth every other day.   prenatal vitamin w/FE, FA 27-1 MG Tabs tablet Take 1 tablet by mouth daily at 12 noon.        Follow-up Information     [facility] Follow up on 10/20/2022.   Contact information: [address] [place] [address] [phone]                Total time spent taking care of this patient: 40 minutes  Signed: Ellouise Newer [facility], CNM  10/19/2022, 6:47 PM

## 2022-10-20 ENCOUNTER — Encounter: Payer: Medicaid Other | Admitting: Medical

## 2022-10-20 ENCOUNTER — Encounter: Payer: Self-pay | Admitting: Obstetrics and Gynecology

## 2022-10-20 ENCOUNTER — Observation Stay
Admission: EM | Admit: 2022-10-20 | Discharge: 2022-10-20 | Disposition: A | Discharge status: Home / Self Care | Payer: Medicaid Other | Attending: Obstetrics | Admitting: Obstetrics

## 2022-10-20 ENCOUNTER — Other Ambulatory Visit: Payer: Self-pay

## 2022-10-20 ENCOUNTER — Telehealth: Payer: Self-pay

## 2022-10-20 DIAGNOSIS — O99353 Diseases of the nervous system complicating pregnancy, third trimester: Secondary | ICD-10-CM | POA: Diagnosis not present

## 2022-10-20 DIAGNOSIS — W19XXXD Unspecified fall, subsequent encounter: Secondary | ICD-10-CM

## 2022-10-20 DIAGNOSIS — O09523 Supervision of elderly multigravida, third trimester: Secondary | ICD-10-CM | POA: Diagnosis not present

## 2022-10-20 DIAGNOSIS — O4703 False labor before 37 completed weeks of gestation, third trimester: Principal | ICD-10-CM | POA: Insufficient documentation

## 2022-10-20 DIAGNOSIS — O9A213 Injury, poisoning and certain other consequences of external causes complicating pregnancy, third trimester: Secondary | ICD-10-CM | POA: Diagnosis not present

## 2022-10-20 DIAGNOSIS — Z3A35 35 weeks gestation of pregnancy: Secondary | ICD-10-CM | POA: Diagnosis not present

## 2022-10-20 DIAGNOSIS — R3981 Functional urinary incontinence: Secondary | ICD-10-CM

## 2022-10-20 DIAGNOSIS — M21611 Bunion of right foot: Secondary | ICD-10-CM

## 2022-10-20 DIAGNOSIS — G809 Cerebral palsy, unspecified: Secondary | ICD-10-CM

## 2022-10-20 LAB — CHLAMYDIA/NGC RT PCR (ARMC ONLY)
Chlamydia Tr: NOT DETECTED
N gonorrhoeae: NOT DETECTED

## 2022-10-20 LAB — WET PREP, GENITAL
Clue Cells Wet Prep HPF POC: NONE SEEN
Sperm: NONE SEEN
Trich, Wet Prep: NONE SEEN
WBC, Wet Prep HPF POC: <10 (ref <10)
Yeast Wet Prep HPF POC: NONE SEEN

## 2022-10-20 MED ORDER — SOD CITRATE-CITRIC ACID 500-334 MG/5ML PO SOLN: 30.0000 mL | ORAL | Status: DC | PRN

## 2022-10-20 MED ORDER — LACTATED RINGERS IV SOLN
500.0000 mL | INTRAVENOUS | Status: DC | PRN
  Administered 2022-10-20: 1000 mL via INTRAVENOUS

## 2022-10-20 MED ORDER — ACETAMINOPHEN 325 MG PO TABS
650.0000 mg | ORAL_TABLET | ORAL | Status: DC | PRN
  Administered 2022-10-20: 650 mg via ORAL
  Filled 2022-10-20: qty 2

## 2022-10-20 MED ORDER — ONDANSETRON HCL 4 MG/2ML IJ SOLN
4.0000 mg | Freq: Four times a day (QID) | INTRAMUSCULAR | Status: DC | PRN
  Filled 2022-10-20: qty 2

## 2022-10-20 MED ORDER — LACTATED RINGERS IV SOLN: INTRAVENOUS | Status: DC

## 2022-10-20 NOTE — Progress Notes (Signed)
Patient called on cell phone and notified that bedside commode was delivered to nursing unit from [facility]. Patient stated she will be by [facility]. Will call the unit and let us know when she has arrived. Nursing staff to bring commode down to patient for [facility].

## 2022-10-20 NOTE — OB Triage Note (Signed)
Pt is a 36yo G2P1, 35w 6d. She arrived to the unit with complaints of contractions via EMS. Pt has cerebral palsy and requires a walker at home with assistance.  She denies vaginal bleeding, reports positive fetal movement, and reports contractions every 15 minutes starting yesterday.  VS stable, monitors applied and assessing.   Initial FHT 135 at 1541. CNM aware of pt arrival

## 2022-10-20 NOTE — OB Triage Note (Signed)
LABOR & DELIVERY OB TRIAGE NOTE  SUBJECTIVE  HPI Robin Hughes is a 37 y.o. G2P1001 at [redacted]w[redacted]d who presents to Labor & Delivery for contractions. She reports that she has been feeling period-like cramping since yesterday and one strong contraction this afternoon. She vomited once last night and has not had anything to eat today. She also states that she has had some brown discharge since her exam yesterday. She endorses good fetal movement. She reports that due to her increasing lack of mobility, she has been frequently incontinent of urine before she can get to the bathroom. She requests a bedside commode to be used at home. I have contacted the social worker on call to try to help facilitate this. She would also like her cesarean to be moved to 38 weeks due to pain and incontinence. She is feeling a little better after IV fluids and has been able to eat.  OB History     Gravida  2   Para  1   Term  1   Preterm      AB      Living  1      SAB      IAB      Ectopic      Multiple      Live Births  1           Scheduled Meds: Continuous Infusions:  lactated ringers 1,000 mL (10/20/22 1849)   lactated ringers     PRN Meds:.acetaminophen, lactated ringers, ondansetron, sodium citrate-citric acid  OBJECTIVE  BP 120/75   Pulse 89   Temp 98.1 F (36.7 C) (Oral)   Resp 18   Ht  (1.499 m)   Wt 79.4 kg   LMP 02/11/2022   SpO2 100%   BMI 35.35 kg/m   General: alert, cooperative, mild distress Abdomen: gravid, non-tender Extremities: +2 pitting edema, large bunions on both feet Cervical exam: Dilation: 1 Effacement (%): 60 Exam by:: Quitman Livings CNM   NST I reviewed the NST and it was reactive.  Baseline: 130 Variability: moderate Accelerations: present Decelerations:none Toco: occasional irritability Category 1  Wet Prep: negative  ASSESSMENT Impression  1) Pregnancy at G2P1001, [redacted]w[redacted]d, Estimated Date of Delivery: 11/18/22. Reassuring  maternal/fetal status 2) Nausea and uterine irritability, resolved  PLAN  1) Discharge home with standard return/labor precautions. RN to arrange transport 2) Keep scheduled ROB visit tomorrow 3) Order placed for bedside commode 4) Referral to podiatry  Katrenia Alkins, CNM 10/20/22  7:23 PM

## 2022-10-20 NOTE — Telephone Encounter (Signed)
Patient had contacted office triage line to report concerns of irregular contractions ( patient had not timed how far apart contractions were) that have been present for the past 24 hrs. Patient states that she was seen in ED yesterday and was given IV fluids and sent home, she states that contractions continued into the night and this morning. Patient stated that when she went to use restroom she was unsure when she stood up if gush of liquid was from urination or her water breaking. Patient stated that she was unsure if she could wait for her appointment with Raynelle Fanning this afternoon at 2:15Pm to be seen. I advised patient to return back to ED immediately for medical evaluation and fetal monitoring, advised patient I will pass message to physician to review and midwife on call. KW

## 2022-10-21 ENCOUNTER — Encounter: Payer: Self-pay | Admitting: Obstetrics and Gynecology

## 2022-10-21 ENCOUNTER — Ambulatory Visit (INDEPENDENT_AMBULATORY_CARE_PROVIDER_SITE_OTHER): Payer: Medicaid Other | Admitting: Obstetrics and Gynecology

## 2022-10-21 VITALS — BP 138/96 | HR 101 | Wt 183.4 lb

## 2022-10-21 DIAGNOSIS — O09522 Supervision of elderly multigravida, second trimester: Secondary | ICD-10-CM

## 2022-10-21 DIAGNOSIS — O10013 Pre-existing essential hypertension complicating pregnancy, third trimester: Secondary | ICD-10-CM

## 2022-10-21 DIAGNOSIS — Z113 Encounter for screening for infections with a predominantly sexual mode of transmission: Secondary | ICD-10-CM

## 2022-10-21 DIAGNOSIS — O09893 Supervision of other high risk pregnancies, third trimester: Secondary | ICD-10-CM

## 2022-10-21 DIAGNOSIS — Z3A36 36 weeks gestation of pregnancy: Secondary | ICD-10-CM

## 2022-10-21 DIAGNOSIS — O09899 Supervision of other high risk pregnancies, unspecified trimester: Secondary | ICD-10-CM

## 2022-10-21 DIAGNOSIS — O99213 Obesity complicating pregnancy, third trimester: Secondary | ICD-10-CM

## 2022-10-21 DIAGNOSIS — O09523 Supervision of elderly multigravida, third trimester: Secondary | ICD-10-CM

## 2022-10-21 DIAGNOSIS — O34219 Maternal care for unspecified type scar from previous cesarean delivery: Secondary | ICD-10-CM

## 2022-10-21 DIAGNOSIS — O10913 Unspecified pre-existing hypertension complicating pregnancy, third trimester: Secondary | ICD-10-CM

## 2022-10-21 DIAGNOSIS — O26899 Other specified pregnancy related conditions, unspecified trimester: Secondary | ICD-10-CM

## 2022-10-21 DIAGNOSIS — R600 Localized edema: Secondary | ICD-10-CM

## 2022-10-21 NOTE — Progress Notes (Unsigned)
ROB [redacted]w[redacted]d: She is having lots of pain. She reports good fetal movement.

## 2022-10-22 NOTE — Progress Notes (Signed)
ROB: Patient is a 37 y.o. G2P1001 at [redacted]w[redacted]d who presents for routine OB care.  Pregnancy is complicated by Cerebral palsy (HCC); Obesity affecting pregnancy in third trimester; Late prenatal care; Multigravida of advanced maternal age in second trimester; Previous cesarean delivery affecting pregnancy; and Chronic hypertension in pregnancy; Patient has complaints of constant pelvic pressure, leaking of urine, difficulty mobilizing to the restroom, and swelling of her feet. Patient and mother desires to know if C-section can be moved up due to discomfort. Discussed that C-section could not be scheduled earlier than 39 weeks unless medical indications Advivsed on pregnancy girdle and compression stockings for discomfort.  Nots she was told she may get a bedside commode. BPs elevated today with repeat borderline.  Advised that if BP remains elevated, this may be an indication to move C-section date.  No meds restarted at this time. RTC in 1 week for OB visit and BP check. Has BPP scheduled. Previously had 36 week cultures done in triage several days ago, negative.

## 2022-10-24 ENCOUNTER — Other Ambulatory Visit: Payer: Self-pay

## 2022-10-24 DIAGNOSIS — G809 Cerebral palsy, unspecified: Secondary | ICD-10-CM

## 2022-10-24 DIAGNOSIS — O34219 Maternal care for unspecified type scar from previous cesarean delivery: Secondary | ICD-10-CM

## 2022-10-24 DIAGNOSIS — O10919 Unspecified pre-existing hypertension complicating pregnancy, unspecified trimester: Secondary | ICD-10-CM

## 2022-10-24 DIAGNOSIS — O09523 Supervision of elderly multigravida, third trimester: Secondary | ICD-10-CM

## 2022-10-24 DIAGNOSIS — O0930 Supervision of pregnancy with insufficient antenatal care, unspecified trimester: Secondary | ICD-10-CM

## 2022-10-24 DIAGNOSIS — O99213 Obesity complicating pregnancy, third trimester: Secondary | ICD-10-CM

## 2022-10-25 ENCOUNTER — Encounter: Payer: Medicaid Other | Admitting: Advanced Practice Midwife

## 2022-10-25 ENCOUNTER — Encounter: Payer: Medicaid Other | Admitting: Obstetrics and Gynecology

## 2022-10-26 ENCOUNTER — Ambulatory Visit (HOSPITAL_BASED_OUTPATIENT_CLINIC_OR_DEPARTMENT_OTHER): Payer: Medicaid Other

## 2022-10-26 ENCOUNTER — Observation Stay
Admission: RE | Admit: 2022-10-26 | Discharge: 2022-10-26 | Disposition: A | Discharge status: Home / Self Care | Payer: Medicaid Other | Source: Ambulatory Visit | Attending: Advanced Practice Midwife | Admitting: Advanced Practice Midwife

## 2022-10-26 ENCOUNTER — Other Ambulatory Visit: Payer: Self-pay

## 2022-10-26 ENCOUNTER — Encounter: Payer: Self-pay | Admitting: Obstetrics and Gynecology

## 2022-10-26 DIAGNOSIS — O99353 Diseases of the nervous system complicating pregnancy, third trimester: Secondary | ICD-10-CM | POA: Insufficient documentation

## 2022-10-26 DIAGNOSIS — O10919 Unspecified pre-existing hypertension complicating pregnancy, unspecified trimester: Secondary | ICD-10-CM

## 2022-10-26 DIAGNOSIS — O10013 Pre-existing essential hypertension complicating pregnancy, third trimester: Secondary | ICD-10-CM

## 2022-10-26 DIAGNOSIS — O99213 Obesity complicating pregnancy, third trimester: Secondary | ICD-10-CM

## 2022-10-26 DIAGNOSIS — O09523 Supervision of elderly multigravida, third trimester: Secondary | ICD-10-CM | POA: Insufficient documentation

## 2022-10-26 DIAGNOSIS — E669 Obesity, unspecified: Secondary | ICD-10-CM

## 2022-10-26 DIAGNOSIS — O368330 Maternal care for abnormalities of the fetal heart rate or rhythm, third trimester, not applicable or unspecified: Principal | ICD-10-CM | POA: Insufficient documentation

## 2022-10-26 DIAGNOSIS — O34219 Maternal care for unspecified type scar from previous cesarean delivery: Secondary | ICD-10-CM

## 2022-10-26 DIAGNOSIS — Z3A36 36 weeks gestation of pregnancy: Secondary | ICD-10-CM | POA: Insufficient documentation

## 2022-10-26 DIAGNOSIS — O10913 Unspecified pre-existing hypertension complicating pregnancy, third trimester: Secondary | ICD-10-CM | POA: Insufficient documentation

## 2022-10-26 DIAGNOSIS — O0933 Supervision of pregnancy with insufficient antenatal care, third trimester: Secondary | ICD-10-CM | POA: Insufficient documentation

## 2022-10-26 DIAGNOSIS — G809 Cerebral palsy, unspecified: Secondary | ICD-10-CM

## 2022-10-26 DIAGNOSIS — O403XX0 Polyhydramnios, third trimester, not applicable or unspecified: Secondary | ICD-10-CM | POA: Insufficient documentation

## 2022-10-26 DIAGNOSIS — O0930 Supervision of pregnancy with insufficient antenatal care, unspecified trimester: Secondary | ICD-10-CM

## 2022-10-26 MED ORDER — ACETAMINOPHEN 500 MG PO TABS
1000.0000 mg | ORAL_TABLET | Freq: Four times a day (QID) | ORAL | Status: DC | PRN
  Administered 2022-10-26: 1000 mg via ORAL
  Filled 2022-10-26: qty 2

## 2022-10-26 NOTE — Discharge Summary (Addendum)
LABOR & DELIVERY OB TRIAGE NOTE  SUBJECTIVE  HPI Robin Hughes is a 37 y.o. G2P1001 at [redacted]w[redacted]d who presents to Labor & Delivery for monitoring after vasovagal episode during MFM ultrasound  OB History     Gravida  2   Para  1   Term  1   Preterm      AB      Living  1      SAB      IAB      Ectopic      Multiple      Live Births  1            PRN Meds:.acetaminophen  OBJECTIVE  BP  135/87   Pulse 84   Temp 98.4 F (36.9 C) (Oral)   Resp 16   Ht 4\' 11"  (1.499 m)   Wt 83 kg   LMP 02/11/2022   BMI 36.96 kg/m   General: A&Ox3, NAD.  Heart: normal rate Lungs: normal work of breathing Abdomen: gravid, nontender Cervical exam:   unchanged, cervix posterior, difficult exam-[name] unable to bend legs due to CP  NST I reviewed the NST and it was reactive.  Baseline: 130 Variability: moderate Accelerations: present Decelerations:none Toco: irritability Category I, RNST, appropriate for gestational age  ASSESSMENT Impression  1) Pregnancy at G2P1001, [redacted]w[redacted]d, Estimated Date of Delivery: 11/18/22 2) Reassuring maternal/fetal status 3) BPP 8/8 on ultrasound, mild poly by MVP of 13.  PLAN Discharge home with labor & fetal movement precautions. Maintain visit 5/2 as scheduled in clinic. rLTCS scheduled 5/17.  Robin Hughes, CNM 10/26/22  12:57 PM

## 2022-10-26 NOTE — OB Triage Note (Signed)
Pt is a 36yo G2P1, 36w 5d. She arrived to the unit from MFM where she had a vagal episode while having an ultrasound completed. MFM reported her last BP was 146/114 after the episode.  She denies vaginal bleeding, reports positive fetal movement, and reports contractions every 15/20 mins apart. VS stable, monitors applied and assessing. Initial BP in unit is 135/87.  Pt has cerebral palsy and has been educated/understands to call before exiting the bed for assistance due to fall precautions.    Initial FHT 135 at 1109.

## 2022-10-26 NOTE — Progress Notes (Signed)
Pt at the MFM clinic today for follow-up & BPP ultrasound.  During ultrasound she started feeling nauseous,pale dizzy and c/o contractions.  Pt was sat up on bedside with assistance.  BP 146/114 Pulse 97.  Ultrasound completed.  Dr. Grace Bushy notified of pt staus change.  Ordered received transport [facility] to [facility] for further monitoring.  Report given to Joyce Copa, RN and pt transported to [employer].  Abbey assisted with pt transfer to bed.  Additional bedside report given to [place].  Pt is a high fall risk due to CP and pregnancy.

## 2022-10-26 NOTE — Progress Notes (Signed)
Discharge instructions provided to pt. Pt verbalizes understanding. Vaginal bleeding and discharge, contractions, and fetal movement reviewed by RN. Follow-up care reviewed - pt will be seen tomorrow 5/2 1335 with Quitman Livings CNM.  Pt transported in wheelchair and assisted into car by RN and surgical tech.     Pt discharged home with father.

## 2022-10-27 ENCOUNTER — Encounter: Payer: Medicaid Other | Admitting: Obstetrics

## 2022-10-27 ENCOUNTER — Encounter: Payer: Self-pay | Admitting: Obstetrics and Gynecology

## 2022-10-27 ENCOUNTER — Other Ambulatory Visit: Payer: Self-pay

## 2022-10-27 ENCOUNTER — Observation Stay
Admission: EM | Admit: 2022-10-27 | Discharge: 2022-10-27 | Disposition: A | Discharge status: Home / Self Care | Payer: Medicaid Other | Attending: Obstetrics | Admitting: Obstetrics

## 2022-10-27 DIAGNOSIS — O99891 Other specified diseases and conditions complicating pregnancy: Secondary | ICD-10-CM | POA: Diagnosis not present

## 2022-10-27 DIAGNOSIS — O99213 Obesity complicating pregnancy, third trimester: Secondary | ICD-10-CM

## 2022-10-27 DIAGNOSIS — Z3A36 36 weeks gestation of pregnancy: Secondary | ICD-10-CM

## 2022-10-27 DIAGNOSIS — O4193X0 Disorder of amniotic fluid and membranes, unspecified, third trimester, not applicable or unspecified: Secondary | ICD-10-CM | POA: Diagnosis present

## 2022-10-27 DIAGNOSIS — O34219 Maternal care for unspecified type scar from previous cesarean delivery: Secondary | ICD-10-CM

## 2022-10-27 DIAGNOSIS — O10913 Unspecified pre-existing hypertension complicating pregnancy, third trimester: Secondary | ICD-10-CM

## 2022-10-27 DIAGNOSIS — O09899 Supervision of other high risk pregnancies, unspecified trimester: Secondary | ICD-10-CM

## 2022-10-27 DIAGNOSIS — N898 Other specified noninflammatory disorders of vagina: Secondary | ICD-10-CM

## 2022-10-27 DIAGNOSIS — O4290 Premature rupture of membranes, unspecified as to length of time between rupture and onset of labor, unspecified weeks of gestation: Secondary | ICD-10-CM | POA: Diagnosis present

## 2022-10-27 DIAGNOSIS — O163 Unspecified maternal hypertension, third trimester: Secondary | ICD-10-CM | POA: Insufficient documentation

## 2022-10-27 DIAGNOSIS — G2581 Restless legs syndrome: Secondary | ICD-10-CM

## 2022-10-27 DIAGNOSIS — G809 Cerebral palsy, unspecified: Secondary | ICD-10-CM

## 2022-10-27 DIAGNOSIS — O0930 Supervision of pregnancy with insufficient antenatal care, unspecified trimester: Secondary | ICD-10-CM

## 2022-10-27 LAB — WET PREP, GENITAL
Clue Cells Wet Prep HPF POC: NONE SEEN
Sperm: NONE SEEN
Trich, Wet Prep: NONE SEEN
WBC, Wet Prep HPF POC: <10 (ref <10)
Yeast Wet Prep HPF POC: NONE SEEN

## 2022-10-27 LAB — ROM PLUS (ARMC ONLY): Rom Plus: NEGATIVE

## 2022-10-27 NOTE — OB Triage Note (Signed)
Patient is a 37 yo, G2P1, at 36 weeks 6 days. Patient presents to labor and delivery via EMS with complaints of LOF that started when she felt a big gush around 1300 today. Patient also reports mild contractions 8-10 minutes apart.  Patient denies any vaginal bleeding. Patient reports +FM. Monitors applied and assessing. VSS. Initial fetal heart tone 145. Eunice Blase, CNM notified of patients arrival to unit. Plan to place in observation for ROM +, wet prep, and fetal monitoring.

## 2022-10-27 NOTE — Discharge Summary (Signed)
   Please see Final Progress Note.  Mirna Mires, CNM  10/27/2022 3:33 PM

## 2022-10-27 NOTE — Progress Notes (Signed)
Discharge instructions provided to patient. Patient verbalized understanding. Pt educated on signs and symptoms of labor, vaginal bleeding, LOF, and fetal movement. Red flag signs reviewed by RN. Patient discharged home with her parents in stable condition.

## 2022-10-27 NOTE — Discharge Instructions (Signed)
If you have concerns about whether you should come to the hospital please feel free to call [phone].

## 2022-10-27 NOTE — Final Progress Note (Signed)
Final Progress Note  Patient ID: Robin Hughes MRN: [id number] DOB/AGE: Apr 02, 1986 37 y.o.  Admit date: 10/27/2022 Admitting provider: Mirna Mires, CNM Discharge date: 10/27/2022   Admission Diagnoses: rule out leaking of amniotic fluid IUP 36 weeks 6 days History of previous Cesarean Section  Discharge Diagnoses:   Intact bag of waters Reactive fetal heart tones  History of Present Illness: The patient is a 37 y.o. female G2P1001 at [redacted]w[redacted]d who presents for questionable leaking of fluid. She reports one incident of leaking some fluid under her clothes. She denies any contractions and reports that her baby has been moving well. She denies any active trickling of fluid now, and expresses uncertainty as to whether she may have urinated or felt SROM. She was seen yesterday on the unit as well. Multiple visits recorded  on Labor and Delivery this pregnancy.  Past Medical History:  Diagnosis Date   Chest pain, unspecified 04/03/2021   CP (cerebral palsy) (HCC)    Hypertension    Indication for care in labor and delivery, antepartum 05/14/2019    Past Surgical History:  Procedure Laterality Date   BACK SURGERY     CESAREAN SECTION  06/29/2019    No current facility-administered medications on file prior to encounter.   Current Outpatient Medications on File Prior to Encounter  Medication Sig Dispense Refill   escitalopram (LEXAPRO) 20 MG tablet Take 20 mg by mouth daily.     ferrous sulfate 325 (65 FE) MG EC tablet Take 1 tablet (325 mg total) by mouth every other day. 30 tablet 3   prenatal vitamin w/FE, FA (PRENATAL 1 + 1) 27-1 MG TABS tablet Take 1 tablet by mouth daily at 12 noon.      No Known Allergies  Social History   Socioeconomic History   Marital status: Single    Spouse name: Not on file   Number of children: Not on file   Years of education: Not on file   Highest education level: Not on file  Occupational History   Not on file  Tobacco Use   Smoking  status: Never   Smokeless tobacco: Never  Vaping Use   Vaping Use: Never used  Substance and Sexual Activity   Alcohol use: No   Drug use: Never   Sexual activity: Not Currently    Birth control/protection: None, Surgical    Comment: tubal  Other Topics Concern   Not on file  Social History Narrative   Not on file   Social Determinants of Health   Financial Resource Strain: Not on file  Food Insecurity: No Food Insecurity (10/27/2022)   Hunger Vital Sign    Worried About Running Out of Food in the Last Year: Never true    Ran Out of Food in the Last Year: Never true  Transportation Needs: No Transportation Needs (10/27/2022)   PRAPARE - Administrator, Civil Service (Medical): No    Lack of Transportation (Non-Medical): No  Physical Activity: Not on file  Stress: Not on file  Social Connections: Not on file  Intimate Partner Violence: Not At Risk (10/27/2022)   Humiliation, Afraid, Rape, and Kick questionnaire    Fear of Current or Ex-Partner: No    Emotionally Abused: No    Physically Abused: No    Sexually Abused: No    Family History  Problem Relation Age of Onset   COPD Mother    Liver disease Mother    Kidney disease Mother    Atrial  fibrillation Mother    Heart attack Father    Heart failure Father    Hypertension Brother    Hypertension Maternal Grandmother    Atrial fibrillation Maternal Grandmother    Kidney disease Maternal Grandmother    Breast cancer Maternal Grandmother    Melanoma Maternal Grandmother      Review of Systems  Constitutional: Negative.   HENT: Negative.    Eyes: Negative.   Respiratory: Negative.    Cardiovascular: Negative.   Gastrointestinal: Negative.   Genitourinary:  Positive for frequency.       She reports having some incidents of uncontrolled urination  Musculoskeletal: Negative.   Skin: Negative.   Neurological: Negative.   Endo/Heme/Allergies: Negative.      Physical Exam: BP 114/77 (BP Location: Left  Arm)   Pulse (!) 107   Temp 97.8 F (36.6 C) (Oral)   Resp 20   LMP 02/11/2022   Physical Exam Constitutional:      Appearance: Normal appearance.  Genitourinary:     Vulva and rectum normal.     Genitourinary Comments: Normal external genitalia. Her external genitalia are dry. No visible leaking of fluid noted with vagal bearing down, or coughing.  HENT:     Head: Normocephalic and atraumatic.  Cardiovascular:     Rate and Rhythm: Normal rate and regular rhythm.     Pulses: Normal pulses.     Heart sounds: Normal heart sounds.  Pulmonary:     Effort: Pulmonary effort is normal.     Breath sounds: Normal breath sounds.  Abdominal:     Palpations: Abdomen is soft.     Comments: Gravid uterus. Per recent scan, she has moderate polyhydramnios.  Musculoskeletal:     Cervical back: Normal range of motion and neck supple.     Comments: Pateint has CP. Limited mobility.  Neurological:     Mental Status: She is alert.  Skin:    General: Skin is warm and dry.  Psychiatric:        Mood and Affect: Mood normal.        Behavior: Behavior normal.     Consults: None  Significant Findings/ Diagnostic Studies: labs:  Results for orders placed or performed during the hospital encounter of 10/27/22 (from the past 24 hour(s))  ROM Plus ([facility] only)     Status: None   Collection Time: 10/27/22  2:49 PM  Result Value Ref Range   Rom Plus NEGATIVE   Wet prep, genital     Status: None   Collection Time: 10/27/22  2:49 PM   Specimen: Vaginal  Result Value Ref Range   Yeast Wet Prep HPF POC NONE SEEN NONE SEEN   Trich, Wet Prep NONE SEEN NONE SEEN   Clue Cells Wet Prep HPF POC NONE SEEN NONE SEEN   WBC, Wet Prep HPF POC <10 <10   Sperm NONE SEEN      Procedures: EFM NST Baseline FHR: 140 beats/min Variability: moderate Accelerations: present Decelerations: absent Tocometry: rare, brief contraction.  Interpretation:  INDICATIONS: rule out uterine contractions RESULTS:  A NST  procedure was performed with FHR monitoring and a normal baseline established, appropriate time of 20-40 minutes of evaluation, and accels >2 seen w 15x15 characteristics.  Results show a REACTIVE NST.    Hospital Course: The patient was admitted to Labor and Delivery Triage for observation.  She was placed on the fetal monitor. Her perineum was evaluated and found to be dry. A ROM Plus test and wet mount were  sent to the lab, and resulted in negative results. With a reassuring and reactive NST and negative testing she is discharged home, with plans for follow up at her next [facility] appointment.  Discharge Condition: good  Disposition: Discharge disposition: 01-Home or Self Care       Diet: Regular diet  Discharge Activity: Activity as tolerated  Discharge Instructions     Fetal Kick Count:  Lie on our left side for one hour after a meal, and count the number of times your baby kicks.  If it is less than 5 times, get up, move around and drink some juice.  Repeat the test 30 minutes later.  If it is still less than 5 kicks in an hour, notify your doctor.   Complete by: As directed    LABOR:  When conractions begin, you should start to time them from the beginning of one contraction to the beginning  of the next.  When contractions are 5 - 10 minutes apart or less and have been regular for at least an hour, you should call your health care provider.   Complete by: As directed    Notify physician for bleeding from the vagina   Complete by: As directed    Notify physician for blurring of vision or spots before the eyes   Complete by: As directed    Notify physician for chills or fever   Complete by: As directed    Notify physician for fainting spells, "black outs" or loss of consciousness   Complete by: As directed    Notify physician for increase in vaginal discharge   Complete by: As directed    Notify physician for leaking of fluid   Complete by: As directed    Notify physician for pain  or burning when urinating   Complete by: As directed    Notify physician for pelvic pressure (sudden increase)   Complete by: As directed    Notify physician for severe or continued nausea or vomiting   Complete by: As directed    Notify physician for sudden gushing of fluid from the vagina (with or without continued leaking)   Complete by: As directed    Notify physician for sudden, constant, or occasional abdominal pain   Complete by: As directed    Notify physician if baby moving less than usual   Complete by: As directed       Allergies as of 10/27/2022   No Known Allergies      Medication List     TAKE these medications    escitalopram 20 MG tablet Commonly known as: LEXAPRO Take 20 mg by mouth daily.   ferrous sulfate 325 (65 FE) MG EC tablet Take 1 tablet (325 mg total) by mouth every other day.   prenatal vitamin w/FE, FA 27-1 MG Tabs tablet Take 1 tablet by mouth daily at 12 noon.         Total time spent taking care of this patient: 35 minutes  Signed: Mirna Mires, CNM  10/27/2022, 3:33 PM

## 2022-10-31 ENCOUNTER — Other Ambulatory Visit: Payer: Self-pay

## 2022-10-31 ENCOUNTER — Encounter: Payer: Self-pay | Admitting: Obstetrics and Gynecology

## 2022-10-31 ENCOUNTER — Inpatient Hospital Stay
Admission: EM | Admit: 2022-10-31 | Discharge: 2022-11-04 | DRG: 784 | Disposition: A | Discharge status: Home / Self Care | Payer: Medicaid Other | Attending: Obstetrics and Gynecology | Admitting: Obstetrics and Gynecology

## 2022-10-31 DIAGNOSIS — Z3A37 37 weeks gestation of pregnancy: Secondary | ICD-10-CM | POA: Diagnosis not present

## 2022-10-31 DIAGNOSIS — O99213 Obesity complicating pregnancy, third trimester: Secondary | ICD-10-CM

## 2022-10-31 DIAGNOSIS — O09523 Supervision of elderly multigravida, third trimester: Secondary | ICD-10-CM | POA: Diagnosis not present

## 2022-10-31 DIAGNOSIS — O34219 Maternal care for unspecified type scar from previous cesarean delivery: Secondary | ICD-10-CM

## 2022-10-31 DIAGNOSIS — G809 Cerebral palsy, unspecified: Secondary | ICD-10-CM | POA: Diagnosis not present

## 2022-10-31 DIAGNOSIS — O34211 Maternal care for low transverse scar from previous cesarean delivery: Secondary | ICD-10-CM | POA: Diagnosis not present

## 2022-10-31 DIAGNOSIS — O99354 Diseases of the nervous system complicating childbirth: Secondary | ICD-10-CM | POA: Diagnosis present

## 2022-10-31 DIAGNOSIS — O1002 Pre-existing essential hypertension complicating childbirth: Secondary | ICD-10-CM | POA: Diagnosis not present

## 2022-10-31 DIAGNOSIS — O99344 Other mental disorders complicating childbirth: Secondary | ICD-10-CM | POA: Diagnosis not present

## 2022-10-31 DIAGNOSIS — O9902 Anemia complicating childbirth: Secondary | ICD-10-CM | POA: Diagnosis present

## 2022-10-31 DIAGNOSIS — Z79899 Other long term (current) drug therapy: Secondary | ICD-10-CM

## 2022-10-31 DIAGNOSIS — Z302 Encounter for sterilization: Secondary | ICD-10-CM | POA: Diagnosis not present

## 2022-10-31 DIAGNOSIS — O4290 Premature rupture of membranes, unspecified as to length of time between rupture and onset of labor, unspecified weeks of gestation: Principal | ICD-10-CM | POA: Diagnosis present

## 2022-10-31 DIAGNOSIS — Z8249 Family history of ischemic heart disease and other diseases of the circulatory system: Secondary | ICD-10-CM | POA: Diagnosis not present

## 2022-10-31 DIAGNOSIS — Z3490 Encounter for supervision of normal pregnancy, unspecified, unspecified trimester: Secondary | ICD-10-CM

## 2022-10-31 DIAGNOSIS — F419 Anxiety disorder, unspecified: Secondary | ICD-10-CM | POA: Diagnosis not present

## 2022-10-31 DIAGNOSIS — O1092 Unspecified pre-existing hypertension complicating childbirth: Secondary | ICD-10-CM | POA: Diagnosis present

## 2022-10-31 DIAGNOSIS — F32A Depression, unspecified: Secondary | ICD-10-CM | POA: Diagnosis present

## 2022-10-31 DIAGNOSIS — O4202 Full-term premature rupture of membranes, onset of labor within 24 hours of rupture: Secondary | ICD-10-CM | POA: Diagnosis not present

## 2022-10-31 DIAGNOSIS — O26893 Other specified pregnancy related conditions, third trimester: Secondary | ICD-10-CM | POA: Diagnosis present

## 2022-10-31 DIAGNOSIS — O0930 Supervision of pregnancy with insufficient antenatal care, unspecified trimester: Secondary | ICD-10-CM

## 2022-10-31 DIAGNOSIS — O10919 Unspecified pre-existing hypertension complicating pregnancy, unspecified trimester: Secondary | ICD-10-CM

## 2022-10-31 MED ORDER — CEFAZOLIN SODIUM-DEXTROSE 2-4 GM/100ML-% IV SOLN
2.0000 g | INTRAVENOUS | Status: AC
  Administered 2022-11-01: 2 g via INTRAVENOUS
  Filled 2022-10-31: qty 100

## 2022-10-31 MED ORDER — SOD CITRATE-CITRIC ACID 500-334 MG/5ML PO SOLN: 30.0000 mL | ORAL | Status: AC

## 2022-10-31 MED ORDER — LACTATED RINGERS IV SOLN: INTRAVENOUS | Status: DC

## 2022-10-31 MED ORDER — SODIUM CHLORIDE 0.9 % IV SOLN
500.0000 mg | INTRAVENOUS | Status: AC
  Administered 2022-11-01: 500 mg via INTRAVENOUS
  Filled 2022-10-31: qty 5

## 2022-10-31 NOTE — H&P (Cosign Needed Addendum)
OB History & Physical   History of Present Illness:  Chief Complaint: water broke  HPI:  Robin Hughes is a 37 y.o. G2P1001 female at [redacted]w[redacted]d dated by LMP.  Her pregnancy has been complicated by cerebral palsy, chronic hypertension, previous cesarean section, multigravida of advanced maternal age, depression .    She feels some abdominal tightness about every 20 minutes.   She reports leakage of fluid at about 9 pm tonight.   She denies vaginal bleeding.   She reports fetal movement.  She denies headache, visual changes, epigastric pain.  She had pizza at 8 pm. Reviewed all with Dr Logan Bores and anesthesia. Plan for 6 am repeat cesarean- sooner if she begins contracting painfully.  Total weight gain for pregnancy: 16.3 kg   Obstetrical Problem List: G3 Problems (from 07/21/22 to present)     Problem Noted Resolved   Labor and delivery indication for care or intervention 10/20/2022 by Glenetta Borg, CNM No      Nursing Staff Provider  Office Location  [facility]  By LMP  Language  English Anatomy US    Flu Vaccine   Genetic Screen  NIPS:   TDaP vaccine    Hgb A1C or  GTT Early : Hgb A1C 5.5 Third trimester : 131  Covid    LAB RESULTS   Rhogam   Blood Type A/Positive/-- (01/05 1452)   Feeding Plan  Antibody Negative (01/05 1452)  Contraception  Rubella 2.05 (01/05 1452)  Circumcision  RPR Non Reactive (03/01 1527)   Pediatrician   HBsAg Negative (01/05 1452)   Support Person  HIV Non Reactive (03/01 1527)  Prenatal Classes  Varicella     GBS NEGATIVE/-- (04/24 1158)(For PCN allergy, check sensitivities)   BTL Consent In chart    VBAC Consent  Pap  07/01/22 negative    Hgb Electro    Pelvis Tested  CF      SMA            Maternal Medical History:   Past Medical History:  Diagnosis Date   Chest pain, unspecified 04/03/2021   CP (cerebral palsy) (HCC)    Hypertension    Indication for care in labor and delivery, antepartum 05/14/2019    Past Surgical  History:  Procedure Laterality Date   BACK SURGERY     CESAREAN SECTION  06/29/2019    No Known Allergies  Prior to Admission medications   Medication Sig Start Date End Date Taking? Authorizing Provider  escitalopram (LEXAPRO) 20 MG tablet Take 20 mg by mouth daily.    [provider]  ferrous sulfate 325 (65 FE) MG EC tablet Take 1 tablet (325 mg total) by mouth every other day. 09/12/22   Glenetta Borg, CNM  prenatal vitamin w/FE, FA (PRENATAL 1 + 1) 27-1 MG TABS tablet Take 1 tablet by mouth daily at 12 noon.    [provider]    OB History  Gravida Para Term Preterm AB Living  2 1 1     1   SAB IAB Ectopic Multiple Live Births          1    # Outcome Date GA Lbr Len/2nd Weight Sex Delivery Anes PTL Lv  2 Current           1 Term 06/29/19   3204 g M CS-Unspec   LIV     Complications: Failure to Progress in First Stage    Prenatal care site: [facility]  Social  History: She  reports that she has never smoked. She has never used smokeless tobacco. She reports that she does not drink alcohol and does not use drugs.  Family History: family history includes Atrial fibrillation in her maternal grandmother and mother; Breast cancer in her maternal grandmother; COPD in her mother; Heart attack in her father; Heart failure in her father; Hypertension in her brother and maternal grandmother; Kidney disease in her maternal grandmother and mother; Liver disease in her mother; Melanoma in her maternal grandmother.    Review of Systems:  Review of Systems  Constitutional:  Negative for chills and fever.  HENT:  Negative for congestion, ear discharge, ear pain, hearing loss, sinus pain and sore throat.   Eyes:  Negative for blurred vision and double vision.  Respiratory:  Negative for cough, shortness of breath and wheezing.   Cardiovascular:  Negative for chest pain, palpitations and leg swelling.  Gastrointestinal:  Negative for abdominal pain, blood in stool,  constipation, diarrhea, heartburn, melena, nausea and vomiting.  Genitourinary:  Negative for dysuria, flank pain, frequency, hematuria and urgency.  Musculoskeletal:  Negative for back pain, joint pain and myalgias.  Skin:  Negative for itching and rash.  Neurological:  Negative for dizziness, tingling, tremors, sensory change, speech change, focal weakness, seizures, loss of consciousness, weakness and headaches.  Endo/Heme/Allergies:  Negative for environmental allergies. Does not bruise/bleed easily.  Psychiatric/Behavioral:  Negative for depression, hallucinations, memory loss, substance abuse and suicidal ideas. The patient is not nervous/anxious and does not have insomnia.      Physical Exam:  Temp 97.7 F (36.5 C) (Oral)   Resp 16   LMP 02/11/2022   Constitutional: Well nourished, well developed female in no acute distress.  HEENT: normal Skin: Warm and dry.  Cardiovascular: Regular rate and rhythm.   Respiratory: Clear to auscultation bilateral. Normal respiratory effort Abdomen: FHT present Back: no CVAT Neuro: DTRs 2+, Cranial nerves grossly intact Psych: Alert and Oriented x3. No memory deficits. Normal mood and affect.   Pelvic exam: grossly ruptured membranes   Baseline FHR: 145 beats/min   Variability: moderate   Accelerations: present   Decelerations: absent Contractions: present frequency: irregular, mild Overall assessment: reassuring   Lab Results  Component Value Date   SARSCOV2NAA NEGATIVE 02/06/2021    Assessment:  Robin Hughes is a 37 y.o. G74P1001 female at [redacted]w[redacted]d with SROM, clear.   Plan:  Admit to Labor & Delivery  CBC, T&S, IVF GBS negative.   Fetal well-being: Category I Plan for repeat cesarean with bilateral tubal ligation at 6 AM (sooner if she starts contracting painfully)    Tresea Mall, CNM 10/31/2022 11:43 PM

## 2022-11-01 ENCOUNTER — Inpatient Hospital Stay: Payer: Medicaid Other | Admitting: Anesthesiology

## 2022-11-01 ENCOUNTER — Encounter: Payer: Medicaid Other | Admitting: Licensed Practical Nurse

## 2022-11-01 ENCOUNTER — Other Ambulatory Visit: Payer: Self-pay

## 2022-11-01 ENCOUNTER — Encounter
Admission: EM | Disposition: A | Discharge status: Home / Self Care | Payer: Self-pay | Source: Home / Self Care | Attending: Obstetrics and Gynecology

## 2022-11-01 ENCOUNTER — Encounter: Payer: Self-pay | Admitting: Obstetrics and Gynecology

## 2022-11-01 DIAGNOSIS — O34211 Maternal care for low transverse scar from previous cesarean delivery: Secondary | ICD-10-CM

## 2022-11-01 DIAGNOSIS — Z302 Encounter for sterilization: Secondary | ICD-10-CM

## 2022-11-01 DIAGNOSIS — Z3A37 37 weeks gestation of pregnancy: Secondary | ICD-10-CM

## 2022-11-01 DIAGNOSIS — O99354 Diseases of the nervous system complicating childbirth: Secondary | ICD-10-CM

## 2022-11-01 DIAGNOSIS — O1002 Pre-existing essential hypertension complicating childbirth: Secondary | ICD-10-CM

## 2022-11-01 DIAGNOSIS — O4202 Full-term premature rupture of membranes, onset of labor within 24 hours of rupture: Secondary | ICD-10-CM

## 2022-11-01 DIAGNOSIS — G809 Cerebral palsy, unspecified: Secondary | ICD-10-CM

## 2022-11-01 DIAGNOSIS — Z3490 Encounter for supervision of normal pregnancy, unspecified, unspecified trimester: Secondary | ICD-10-CM

## 2022-11-01 DIAGNOSIS — F32A Depression, unspecified: Secondary | ICD-10-CM

## 2022-11-01 DIAGNOSIS — F419 Anxiety disorder, unspecified: Secondary | ICD-10-CM

## 2022-11-01 DIAGNOSIS — O09523 Supervision of elderly multigravida, third trimester: Secondary | ICD-10-CM

## 2022-11-01 DIAGNOSIS — O99344 Other mental disorders complicating childbirth: Secondary | ICD-10-CM

## 2022-11-01 DIAGNOSIS — O09893 Supervision of other high risk pregnancies, third trimester: Secondary | ICD-10-CM

## 2022-11-01 LAB — COMPREHENSIVE METABOLIC PANEL
ALT: 37 U/L (ref 0–44)
AST: 32 U/L (ref 15–41)
Albumin: 2.3 g/dL — ABNORMAL LOW (ref 3.5–5.0)
Alkaline Phosphatase: 142 U/L — ABNORMAL HIGH (ref 38–126)
Anion gap: 8 (ref 5–15)
BUN: 10 mg/dL (ref 6–20)
CO2: 21 mmol/L — ABNORMAL LOW (ref 22–32)
Calcium: 8.8 mg/dL — ABNORMAL LOW (ref 8.9–10.3)
Chloride: 106 mmol/L (ref 98–111)
Creatinine, Ser: 0.71 mg/dL (ref 0.44–1.00)
GFR, Estimated: >60 mL/min (ref >60)
Glucose, Bld: 120 mg/dL — ABNORMAL HIGH (ref 70–99)
Potassium: 3.6 mmol/L (ref 3.5–5.1)
Sodium: 135 mmol/L (ref 135–145)
Total Bilirubin: 0.5 mg/dL (ref 0.3–1.2)
Total Protein: 6.6 g/dL (ref 6.5–8.1)

## 2022-11-01 LAB — TYPE AND SCREEN
ABO/RH(D): A POS
Antibody Screen: NEGATIVE

## 2022-11-01 LAB — CBC
HCT: 31.9 % — ABNORMAL LOW (ref 36.0–46.0)
Hemoglobin: 9.7 g/dL — ABNORMAL LOW (ref 12.0–15.0)
MCH: 23.8 pg — ABNORMAL LOW (ref 26.0–34.0)
MCHC: 30.4 g/dL (ref 30.0–36.0)
MCV: 78.4 fL — ABNORMAL LOW (ref 80.0–100.0)
Platelets: 280 10*3/uL (ref 150–400)
RBC: 4.07 MIL/uL (ref 3.87–5.11)
RDW: 19.5 % — ABNORMAL HIGH (ref 11.5–15.5)
WBC: 9.6 10*3/uL (ref 4.0–10.5)
nRBC: 0 % (ref 0.0–0.2)

## 2022-11-01 LAB — PROTEIN / CREATININE RATIO, URINE
Creatinine, Urine: 237 mg/dL
Protein Creatinine Ratio: 0.23 mg/mg{Cre} — ABNORMAL HIGH (ref 0.00–0.15)
Total Protein, Urine: 54 mg/dL

## 2022-11-01 SURGERY — Surgical Case
Anesthesia: Spinal

## 2022-11-01 MED ORDER — SODIUM CHLORIDE 0.9% FLUSH: 3.0000 mL | INTRAVENOUS | Status: DC | PRN

## 2022-11-01 MED ORDER — SCOPOLAMINE 1 MG/3DAYS TD PT72: 1.0000 | MEDICATED_PATCH | Freq: Once | TRANSDERMAL | Status: DC

## 2022-11-01 MED ORDER — DIPHENHYDRAMINE HCL 25 MG PO CAPS: 25.0000 mg | ORAL_CAPSULE | ORAL | Status: DC | PRN

## 2022-11-01 MED ORDER — DIPHENHYDRAMINE HCL 25 MG PO CAPS: 25.0000 mg | ORAL_CAPSULE | Freq: Four times a day (QID) | ORAL | Status: DC | PRN

## 2022-11-01 MED ORDER — NALOXONE HCL 0.4 MG/ML IJ SOLN: 0.4000 mg | INTRAMUSCULAR | Status: DC | PRN

## 2022-11-01 MED ORDER — ZOLPIDEM TARTRATE 5 MG PO TABS: 5.0000 mg | ORAL_TABLET | Freq: Every evening | ORAL | Status: DC | PRN

## 2022-11-01 MED ORDER — MORPHINE SULFATE (PF) 0.5 MG/ML IJ SOLN
INTRAMUSCULAR | Status: AC
  Filled 2022-11-01: qty 10

## 2022-11-01 MED ORDER — OXYTOCIN-SODIUM CHLORIDE 30-0.9 UT/500ML-% IV SOLN
2.5000 [IU]/h | INTRAVENOUS | Status: AC
  Administered 2022-11-01: 2.5 [IU]/h via INTRAVENOUS

## 2022-11-01 MED ORDER — ONDANSETRON HCL 4 MG/2ML IJ SOLN
INTRAMUSCULAR | Status: AC
  Filled 2022-11-01: qty 2

## 2022-11-01 MED ORDER — SIMETHICONE 80 MG PO CHEW
80.0000 mg | CHEWABLE_TABLET | Freq: Four times a day (QID) | ORAL | Status: DC
  Administered 2022-11-01 – 2022-11-04 (×11): 80 mg via ORAL
  Filled 2022-11-01 (×12): qty 1

## 2022-11-01 MED ORDER — KETOROLAC TROMETHAMINE 30 MG/ML IJ SOLN: 30.0000 mg | Freq: Four times a day (QID) | INTRAMUSCULAR | Status: AC | PRN

## 2022-11-01 MED ORDER — OXYTOCIN-SODIUM CHLORIDE 30-0.9 UT/500ML-% IV SOLN
INTRAVENOUS | Status: DC | PRN
  Administered 2022-11-01: 300 mL via INTRAVENOUS

## 2022-11-01 MED ORDER — KETOROLAC TROMETHAMINE 30 MG/ML IJ SOLN
INTRAMUSCULAR | Status: DC | PRN
  Administered 2022-11-01: 30 mg via INTRAVENOUS

## 2022-11-01 MED ORDER — OXYTOCIN-SODIUM CHLORIDE 30-0.9 UT/500ML-% IV SOLN
INTRAVENOUS | Status: AC
  Filled 2022-11-01: qty 500

## 2022-11-01 MED ORDER — ONDANSETRON HCL 4 MG/2ML IJ SOLN
INTRAMUSCULAR | Status: DC | PRN
  Administered 2022-11-01: 4 mg via INTRAVENOUS

## 2022-11-01 MED ORDER — IBUPROFEN 600 MG PO TABS
600.0000 mg | ORAL_TABLET | Freq: Four times a day (QID) | ORAL | Status: AC
  Administered 2022-11-02 – 2022-11-04 (×10): 600 mg via ORAL
  Filled 2022-11-01 (×10): qty 1

## 2022-11-01 MED ORDER — BUPIVACAINE IN DEXTROSE 0.75-8.25 % IT SOLN
INTRATHECAL | Status: DC | PRN
  Administered 2022-11-01: 1.4 mL via INTRATHECAL

## 2022-11-01 MED ORDER — LACTATED RINGERS IV SOLN: INTRAVENOUS | Status: DC

## 2022-11-01 MED ORDER — OXYCODONE-ACETAMINOPHEN 5-325 MG PO TABS
1.0000 | ORAL_TABLET | ORAL | Status: DC | PRN
  Administered 2022-11-01 (×2): 2 via ORAL
  Administered 2022-11-02 (×3): 1 via ORAL
  Administered 2022-11-02: 2 via ORAL
  Administered 2022-11-02 – 2022-11-03 (×3): 1 via ORAL
  Filled 2022-11-01: qty 1
  Filled 2022-11-01 (×2): qty 2
  Filled 2022-11-01 (×6): qty 1
  Filled 2022-11-01: qty 2

## 2022-11-01 MED ORDER — SENNOSIDES-DOCUSATE SODIUM 8.6-50 MG PO TABS
2.0000 | ORAL_TABLET | ORAL | Status: DC
  Administered 2022-11-02 – 2022-11-03 (×2): 2 via ORAL
  Filled 2022-11-01 (×3): qty 2

## 2022-11-01 MED ORDER — OXYCODONE HCL 5 MG PO TABS
5.0000 mg | ORAL_TABLET | Freq: Four times a day (QID) | ORAL | Status: DC | PRN
  Administered 2022-11-01: 5 mg via ORAL
  Filled 2022-11-01: qty 1

## 2022-11-01 MED ORDER — PHENYLEPHRINE HCL-NACL 20-0.9 MG/250ML-% IV SOLN
INTRAVENOUS | Status: AC
  Filled 2022-11-01: qty 250

## 2022-11-01 MED ORDER — LACTATED RINGERS IV BOLUS
500.0000 mL | Freq: Once | INTRAVENOUS | Status: AC
  Administered 2022-11-01: 500 mL via INTRAVENOUS

## 2022-11-01 MED ORDER — FENTANYL CITRATE (PF) 100 MCG/2ML IJ SOLN
INTRAMUSCULAR | Status: AC
  Filled 2022-11-01: qty 2

## 2022-11-01 MED ORDER — ONDANSETRON HCL 4 MG/2ML IJ SOLN
4.0000 mg | Freq: Once | INTRAMUSCULAR | Status: AC
  Administered 2022-11-01: 4 mg via INTRAVENOUS

## 2022-11-01 MED ORDER — KETOROLAC TROMETHAMINE 30 MG/ML IJ SOLN
30.0000 mg | Freq: Four times a day (QID) | INTRAMUSCULAR | Status: AC | PRN
  Administered 2022-11-01 (×2): 30 mg via INTRAVENOUS
  Filled 2022-11-01 (×3): qty 1

## 2022-11-01 MED ORDER — DIPHENHYDRAMINE HCL 50 MG/ML IJ SOLN: 12.5000 mg | INTRAMUSCULAR | Status: DC | PRN

## 2022-11-01 MED ORDER — 0.9 % SODIUM CHLORIDE (POUR BTL) OPTIME
TOPICAL | Status: DC | PRN
  Administered 2022-11-01: 1000 mL

## 2022-11-01 MED ORDER — NALOXONE HCL 4 MG/10ML IJ SOLN: 1.0000 ug/kg/h | INTRAVENOUS | Status: DC | PRN

## 2022-11-01 MED ORDER — MORPHINE SULFATE (PF) 0.5 MG/ML IJ SOLN
INTRAMUSCULAR | Status: DC | PRN
  Administered 2022-11-01: .1 mg via INTRATHECAL

## 2022-11-01 MED ORDER — FENTANYL CITRATE (PF) 100 MCG/2ML IJ SOLN
INTRAMUSCULAR | Status: DC | PRN
  Administered 2022-11-01: 15 ug via INTRATHECAL

## 2022-11-01 MED ORDER — ONDANSETRON HCL 4 MG/2ML IJ SOLN: 4.0000 mg | Freq: Three times a day (TID) | INTRAMUSCULAR | Status: DC | PRN

## 2022-11-01 MED ORDER — EPHEDRINE SULFATE (PRESSORS) 50 MG/ML IJ SOLN
INTRAMUSCULAR | Status: DC | PRN
  Administered 2022-11-01: 5 mg via INTRAVENOUS

## 2022-11-01 MED ORDER — MENTHOL 3 MG MT LOZG: 1.0000 | LOZENGE | OROMUCOSAL | Status: DC | PRN

## 2022-11-01 MED ORDER — SOD CITRATE-CITRIC ACID 500-334 MG/5ML PO SOLN
ORAL | Status: AC
  Administered 2022-11-01: 30 mL via ORAL
  Filled 2022-11-01: qty 15

## 2022-11-01 MED ORDER — PRENATAL MULTIVITAMIN CH
1.0000 | ORAL_TABLET | Freq: Every day | ORAL | Status: DC
  Filled 2022-11-01 (×3): qty 1

## 2022-11-01 MED ORDER — PHENYLEPHRINE HCL-NACL 20-0.9 MG/250ML-% IV SOLN
INTRAVENOUS | Status: DC | PRN
  Administered 2022-11-01: 50 ug/min via INTRAVENOUS

## 2022-11-01 MED ORDER — LIDOCAINE 5 % EX PTCH
MEDICATED_PATCH | CUTANEOUS | Status: AC
  Administered 2022-11-01: 1
  Filled 2022-11-01: qty 1

## 2022-11-01 SURGICAL SUPPLY — 28 items
ADHESIVE MASTISOL STRL (MISCELLANEOUS) ×1 IMPLANT
BAG COUNTER SPONGE SURGICOUNT (BAG) ×1 IMPLANT
BENZOIN TINCTURE PRP APPL 2/3 (GAUZE/BANDAGES/DRESSINGS) IMPLANT
CHLORAPREP W/TINT 26 (MISCELLANEOUS) ×2 IMPLANT
DRESSING TELFA 8X10 (GAUZE/BANDAGES/DRESSINGS) IMPLANT
DRSG TELFA 3X8 NADH STRL (GAUZE/BANDAGES/DRESSINGS) ×1 IMPLANT
GAUZE SPONGE 4X4 12PLY STRL (GAUZE/BANDAGES/DRESSINGS) ×1 IMPLANT
GLOVE PI ORTHO PRO STRL 7.5 (GLOVE) ×1 IMPLANT
GOWN STRL REUS W/ TWL LRG LVL3 (GOWN DISPOSABLE) ×2 IMPLANT
GOWN STRL REUS W/TWL LRG LVL3 (GOWN DISPOSABLE) ×2
KIT TURNOVER KIT A (KITS) ×1 IMPLANT
MANIFOLD NEPTUNE II (INSTRUMENTS) ×1 IMPLANT
MAT PREVALON FULL STRYKER (MISCELLANEOUS) ×1 IMPLANT
NS IRRIG 1000ML POUR BTL (IV SOLUTION) ×1 IMPLANT
PACK C SECTION AR (MISCELLANEOUS) ×1 IMPLANT
PAD OB MATERNITY 4.3X12.25 (PERSONAL CARE ITEMS) ×1 IMPLANT
PAD PREP OB/GYN DISP 24X41 (PERSONAL CARE ITEMS) ×1 IMPLANT
RETRACTOR WND ALEXIS-O 25 LRG (MISCELLANEOUS) ×1 IMPLANT
RTRCTR WOUND ALEXIS O 25CM LRG (MISCELLANEOUS) ×1
SCRUB CHG 4% DYNA-HEX 4OZ (MISCELLANEOUS) ×1 IMPLANT
SPONGE T-LAP 18X18 ~~LOC~~+RFID (SPONGE) ×1 IMPLANT
STRIP CLOSURE SKIN 1X5 (GAUZE/BANDAGES/DRESSINGS) IMPLANT
SUT VIC AB 0 CTX 36 (SUTURE) ×2
SUT VIC AB 0 CTX36XBRD ANBCTRL (SUTURE) ×2 IMPLANT
SUT VIC AB 1 CT1 36 (SUTURE) ×2 IMPLANT
SUT VICRYL+ 3-0 36IN CT-1 (SUTURE) ×2 IMPLANT
TRAP FLUID SMOKE EVACUATOR (MISCELLANEOUS) ×1 IMPLANT
WATER STERILE IRR 500ML POUR (IV SOLUTION) ×1 IMPLANT

## 2022-11-01 NOTE — Anesthesia Preprocedure Evaluation (Signed)
Anesthesia Evaluation  Patient identified by MRN, date of birth, ID band Patient awake    Reviewed: Allergy & Precautions, NPO status , Patient's Chart, lab work & pertinent test results  History of Anesthesia Complications Negative for: history of anesthetic complications  Airway Mallampati: III  TM Distance: >3 FB Neck ROM: full    Dental no notable dental hx.    Pulmonary neg pulmonary ROS   Pulmonary exam normal        Cardiovascular Exercise Tolerance: Good hypertension, Normal cardiovascular exam     Neuro/Psych Cerebral palsy     GI/Hepatic negative GI ROS,,,  Endo/Other    Renal/GU   negative genitourinary   Musculoskeletal   Abdominal   Peds  Hematology negative hematology ROS (+)   Anesthesia Other Findings Past Medical History: 04/03/2021: Chest pain, unspecified No date: CP (cerebral palsy) (HCC) No date: Hypertension 05/14/2019: Indication for care in labor and delivery, antepartum  Past Surgical History: No date: BACK SURGERY 06/29/2019: CESAREAN SECTION     Reproductive/Obstetrics (+) Pregnancy                             Anesthesia Physical Anesthesia Plan  ASA: 2  Anesthesia Plan: Spinal   Post-op Pain Management:    Induction:   PONV Risk Score and Plan:   Airway Management Planned: Natural Airway and Nasal Cannula  Additional Equipment:   Intra-op Plan:   Post-operative Plan:   Informed Consent: I have reviewed the patients History and Physical, chart, labs and discussed the procedure including the risks, benefits and alternatives for the proposed anesthesia with the patient or authorized representative who has indicated his/her understanding and acceptance.     Dental Advisory Given  Plan Discussed with: Anesthesiologist  Anesthesia Plan Comments: (Patient reports no bleeding problems and no anticoagulant use.  Plan for spinal with  backup GA  Patient consented for risks of anesthesia including but not limited to:  - adverse reactions to medications - damage to eyes, teeth, lips or other oral mucosa - nerve damage due to positioning  - risk of bleeding, infection and or nerve damage from spinal that could lead to paralysis - risk of headache or failed spinal - damage to teeth, lips or other oral mucosa - sore throat or hoarseness - damage to heart, brain, nerves, lungs, other parts of body or loss of life  Patient voiced understanding.)        Anesthesia Quick Evaluation

## 2022-11-01 NOTE — Op Note (Addendum)
     OP NOTE  Date: 11/01/2022   7:28 AM Name Robin Hughes MR# [id number]  Preoperative Diagnosis: 1. Intrauterine pregnancy at [redacted]w[redacted]d 2. Desires Permanent Sterilization 3. SROM 4. Previous CD - desires repeat  Principal Problem:   Amniotic fluid leaking Active Problems:   Pregnancy  Postoperative Diagnosis: 1. Intrauterine pregnancy at [redacted]w[redacted]d, delivered 2. Desires Permanent Sterilization 3. Viable infant 4. Remainder same as pre-op  Procedure: 1. Repeat Low-Transverse Cesarean Section 2. Bilateral Tubal Occlusion  Surgeon: Elonda Husky, MD  Assistant:  Jillyn Hidden   No other capable assistant was available for this surgery which requires an experienced, high level assistant.  Anesthesia:  Spinal  EBL: 300  ml    Findings: 1) female infant, Apgar scores of 7   at 1 minute and 9   at 5 minutes and a birthweight of 116.05  ounces.    2) Normal uterus, tubes and ovaries.   Procedure:   The patient was prepped and draped in the supine position and placed under spinal anesthesia.  A transverse incision was made across the abdomen in a Pfannenstiel manner. If indicated the old scar was systematically removed with sharp dissection.  We carried the dissection down to the level of the fascia.  The fascia was incised in a curvilinear manner.  The fascia was then elevated from the rectus muscles with blunt and sharp dissection.  The rectus muscles were separated laterally exposing the peritoneum.  The peritoneum was carefully entered with care being taken to avoid bowel and bladder.  A self-retaining retractor was placed.  The visceral peritoneum was incised in a curvilinear fashion across the lower uterine segment creating a bladder flap. A transverse incision was made across the lower uterine segment and extended laterally and superiorly using the bandage scissors.  Artificial rupture membranes was performed and Clear fluid was noted.  The infant was delivered from the cephalic  position.  A double nuchal cord was present. The cord was doubly clamped and cut. Cord blood was obtained if appropriate.  The infant was handed to the pediatric personnel  who then placed the infant under heat lamps where it was cleaned dried and re-suctioned. The placenta was delivered. The hysterotomy incision was then identified on ring forceps.  The uterine cavity was cleaned with a moist lap sponge.  The hysterotomy incision was closed with a running interlocking suture of Vicryl.  Hemostasis was excellent.  Pitocin was run in the IV and the uterus was found to be firm. The fallopian tubes were identified  and followed out to their fine fimbriated ends and then back to the mid-portion of the tube which was elevated on a babcock clamp.  Both tubes were triply tied in a modified Pomeroy manner using plain suture. Hemostasis was noted. The posterior cul-de-sac and gutters were cleaned and inspected.  Hemostasis was noted.  The fascia was then closed with a running suture of #1 Vicryl.  Hemostasis of the subcutaneous tissues was obtained using the Bovie.  The subcutaneous tissues were closed with a running suture of 000 Vicryl.  A subcuticular suture was placed.  Steri-Strips were applied in the usual manner.  A pressure dressing was placed.  The patient went to the recovery room in stable condition. [place] CNM provided exposure, dissection, suctioning, retraction, and general support and assistance during the procedure.    Elonda Husky, M.D. 11/01/2022 7:28 AM

## 2022-11-01 NOTE — Progress Notes (Incomplete)
Urine output increasing to about 166ml/hr from 76ml/hr. IV fluids discontinued, encouraged PO fluids. Patient requests to hold off on foley removal for another hour to rest. Plan to discontinue foley and ambulate to bedside commode at least Q2hrs. Patient reports being able to tell when needed to urinate before pregnancy but had increased urgency and difficulty controlling bladder during pregnancy. Patient q2 turns with 1x RN assist.   Patient had some mild dyspnea at rest when laying less than 30 degrees HOB. Increased HOB to >45 degrees when repositioning, and instructed to use incentive spirometer and cough deep breathe. O2 sats now consistently >95% and dyspnea improved.

## 2022-11-01 NOTE — Anesthesia Procedure Notes (Addendum)
Spinal  Patient location during procedure: OR Start time: 11/01/2022 6:10 AM End time: 11/01/2022 6:20 AM Reason for block: surgical anesthesia Staffing Performed: resident/CRNA  Resident/CRNA: Malva Cogan, CRNA Performed by: Malva Cogan, CRNA Authorized by: Louie Boston, MD   Preanesthetic Checklist Completed: patient identified, IV checked, site marked, risks and benefits discussed, surgical consent, monitors and equipment checked, pre-op evaluation and timeout performed Spinal Block Patient position: sitting Prep: Betadine Patient monitoring: heart rate, continuous pulse ox, blood pressure and cardiac monitor Approach: midline Location: L4-5 Injection technique: single-shot Needle Needle type: Whitacre and Introducer  Needle gauge: 24 G Needle length: 9 cm Assessment Sensory level: T3 Events: CSF return Additional Notes Negative paresthesia. Negative blood return. Positive free-flowing CSF. Expiration date of kit checked and confirmed. Patient tolerated procedure well, without complications.

## 2022-11-01 NOTE — Transfer of Care (Signed)
Immediate Anesthesia Transfer of Care Note  Patient: Robin Hughes  Procedure(s) Performed: REPEAT CESAREAN SECTION  Patient Location: Mother/Baby  Anesthesia Type:Spinal  Level of Consciousness: awake, alert , and oriented  Airway & Oxygen Therapy: Patient Spontanous Breathing  Post-op Assessment: Report given to RN and Post -op Vital signs reviewed and stable  Post vital signs: Reviewed and stable  Last Vitals:  Vitals Value Taken Time  BP 115/71   Temp 97.35f   Pulse 72   Resp 21   SpO2 98     Last Pain:  Vitals:   10/31/22 2319  TempSrc:   PainSc: 2          Complications: No notable events documented.

## 2022-11-01 NOTE — Evaluation (Signed)
Physical Therapy Evaluation Patient Details Name: Robin Hughes MRN: [id number] DOB: 03/22/1986 Today's Date: 11/01/2022  History of Present Illness  Pt is a 37 y/o F admitted on 10/31/22 after presenting with c/o fluid leakage (pt [redacted]w[redacted]d pregnant). Pt s/p c-section on 11/01/22 & there are concerns for mobility as pt with hx of CP & mobilizes with RW. PT consult was placed. PMH: CP, chronic HTN, previous cesarean section, depression  Clinical Impression  Pt seen for PT evaluation with pt agreeable, aunt present in room. Prior to admission pt was mod I with RW but in the past 2 weeks pt has required increased assistance (nurse reports pt has been total assist when presenting to the hospital but family reports pt has still been ambulating household distances). On this date, pt presents with pain 2/2 abdominal incision but willing to participate. PT educated pt on log rolling with pt using hospital bed features but pt requires max assist overall. Pt is able to transfer STS & step pivot bed>recliner with RW & max assist (+2 present for safety). Pt demonstrates slow movement (?increased tone in all extremities, but pt reports L side is chronically weaker), decreased weight shifting to L to advance RLE during transfer. Discussed DME & that pt could benefit from hoyer lift to decrease caregiver burden, as well as hospital bed, but pt's aunt feels pt will be fine at home with family assistance. Pt notes during last pregnancy she had this same experience of increased weakness that improved within a couple weeks of delivery. Will continue to follow pt acutely to progress independence with bed mobility, transfers & gait as able.     Recommendations for follow up therapy are one component of a multi-disciplinary discharge planning process, led by the attending physician.  Recommendations may be updated based on patient status, additional functional criteria and insurance authorization.  Follow Up Recommendations        Assistance Recommended at Discharge Frequent or constant Supervision/Assistance  Patient can return home with the following  Two people to help with walking and/or transfers;Two people to help with bathing/dressing/bathroom;Assist for transportation;Direct supervision/assist for financial management;Assistance with cooking/housework;Help with stairs or ramp for entrance    Equipment Recommendations Hospital bed (hoyer lift with sling)  Recommendations for Other Services       Functional Status Assessment Patient has had a recent decline in their functional status and demonstrates the ability to make significant improvements in function in a reasonable and predictable amount of time.     Precautions / Restrictions Precautions Precautions: Fall Restrictions Weight Bearing Restrictions: No      Mobility  Bed Mobility Overal bed mobility: Needs Assistance Bed Mobility: Rolling, Sidelying to Sit Rolling: Max assist Sidelying to sit: Max assist, HOB elevated       General bed mobility comments: PT provides education re: log rolling, pt requires MAX assist to come to to sitting EOB with use of hospital bed features.    Transfers Overall transfer level: Needs assistance Equipment used: Rolling walker (2 wheels) Transfers: Sit to/from Stand, Bed to chair/wheelchair/BSC Sit to Stand: Max assist   Step pivot transfers: Max assist, +2 safety/equipment       General transfer comment: Pt with difficulty weight shifting to L to advance RLE, extra time to complete transfer, cuing for sequencing.    Ambulation/Gait                  Careers information officer  Modified Rankin (Stroke Patients Only)       Balance Overall balance assessment: Needs assistance Sitting-balance support: Feet supported, Bilateral upper extremity supported Sitting balance-Leahy Scale: Fair     Standing balance support: During functional activity, Bilateral upper  extremity supported, Reliant on assistive device for balance Standing balance-Leahy Scale: Poor                               Pertinent Vitals/Pain Pain Assessment Pain Assessment: Faces Faces Pain Scale: Hurts whole lot Pain Location: c-section incision Pain Descriptors / Indicators: Discomfort, Grimacing, Guarding Pain Intervention(s): Monitored during session, Limited activity within patient's tolerance, Repositioned    Home Living Family/patient expects to be discharged to:: Private residence Living Arrangements: Parent Available Help at Discharge: Family;Available 24 hours/day Type of Home: House Home Access: Stairs to enter Entrance Stairs-Rails:  (does not report) Entrance Stairs-Number of Steps: 5   Home Layout: One level Home Equipment: Agricultural consultant (2 wheels);Wheelchair Financial trader (4 wheels)      Prior Function               Mobility Comments: Pt was ambulatory with RW until the last 2 weeks where pt has required assistance for bed mobility, transfers, short distance gait.       Hand Dominance        Extremity/Trunk Assessment   Upper Extremity Assessment Upper Extremity Assessment: Generalized weakness    Lower Extremity Assessment Lower Extremity Assessment: Generalized weakness       Communication      Cognition Arousal/Alertness: Awake/alert Behavior During Therapy: WFL for tasks assessed/performed Overall Cognitive Status: Within Functional Limits for tasks assessed                                 General Comments: Pt able to follow commands with slightly extra time during session.        General Comments      Exercises     Assessment/Plan    PT Assessment Patient needs continued PT services  PT Problem List Decreased strength;Pain;Decreased range of motion;Decreased activity tolerance;Decreased balance;Decreased mobility;Decreased knowledge of use of DME       PT Treatment Interventions  Therapeutic exercise;DME instruction;Gait training;Neuromuscular re-education;Stair training;Balance training;Functional mobility training;Therapeutic activities;Patient/family education    PT Goals (Current goals can be found in the Care Plan section)  Acute Rehab PT Goals Patient Stated Goal: decreased pain, get better PT Goal Formulation: With patient Time For Goal Achievement: 11/15/22 Potential to Achieve Goals: Fair    Frequency Min 2X/week     Co-evaluation               AM-PAC PT "6 Clicks" Mobility  Outcome Measure Help needed turning from your back to your side while in a flat bed without using bedrails?: Total Help needed moving from lying on your back to sitting on the side of a flat bed without using bedrails?: Total Help needed moving to and from a bed to a chair (including a wheelchair)?: Total Help needed standing up from a chair using your arms (e.g., wheelchair or bedside chair)?: Total Help needed to walk in hospital room?: Total Help needed climbing 3-5 steps with a railing? : Total 6 Click Score: 6    End of Session   Activity Tolerance: Patient limited by pain Patient left: in chair;with nursing/sitter in room;with family/visitor present Nurse Communication: Mobility status  PT Visit Diagnosis: Other abnormalities of gait and mobility (R26.89);Muscle weakness (generalized) (M62.81);Difficulty in walking, not elsewhere classified (R26.2)    Time: 1610-9604 PT Time Calculation (min) (ACUTE ONLY): 25 min   Charges:   PT Evaluation $PT Eval High Complexity: 1 High          Aleda Grana, PT, DPT 11/01/22, 3:05 PM   Sandi Mariscal 11/01/2022, 3:02 PM

## 2022-11-01 NOTE — Progress Notes (Signed)
Urine output increasing to about 163ml/hr from 5ml/hr. IV fluids discontinued, encouraged PO fluids. Patient requests to hold off on foley removal for another hour to rest. Plan to discontinue foley and ambulate to bedside commode at least Q2hrs. Patient reports being able to tell when needed to urinate before pregnancy but had increased urgency and difficulty controlling bladder during pregnancy. Patient q2 turns with 1x RN assist.   Patient had some mild dyspnea at rest when laying less than 30 degrees HOB. Increased HOB to >45 degrees when repositioning, and instructed to use incentive spirometer and cough deep breathe. O2 sats now consistently >95% and dyspnea improved.   Foley removed. Patient has voided spontaneously x3 every 1-2hrs w/ bedpan due to increased urgency. Working on pain management to get OOB with staff assist. Declined assist to get OOB overnight.

## 2022-11-01 NOTE — TOC Initial Note (Signed)
Transition of Care [facility]) - Initial/Assessment Note    Patient Details  Name: Robin Hughes MRN: [id number] Date of Birth: Feb 18, 1986  Transition of Care [facility]) CM/SW Contact:    Margarito Liner, LCSW Phone Number: 11/01/2022, 3:08 PM  Clinical Narrative:   CSW met with patient. Aunt at bedside along with patient's new baby, Robin Hughes, and toddler, Robin Hughes. CSW introduced role and explained that discharge planning would be discussed. Patient and her children live with her parents. Patient confirms having all the assistance she needs at home to help care for the kids. Patient has all needed supplies but did request that we send her home with extra diapers. RN is aware. Aunt said they are trying to find her a dresser. She confirmed she has a new carseat. Her parents will pick them up at discharge. Patient takes her oldest son to [facility] on [facility]. She said she was told to wait before making appointment. Emailed financial counselors asking them to help her get the baby's Medicaid. Patient has WIC. Told her that she will have to go to walk-in appointment at [facility] and what she needed to bring. She stated she has an appointment at [facility] on 5/24. PT recommending home health PT and a hoyer lift and hospital bed. Patient and aunt do not feel that she needs DME but will have RN contact CSW if they change their mind. Patient is agreeable to home health. CSW starting search. No further concerns. CSW encouraged patient to contact CSW as needed. CSW will continue to follow patient for support and facilitate return home once stable.            Expected Discharge Plan: Home w Home Health Services Barriers to Discharge: Continued Medical Work up   Patient Goals and CMS Choice            Expected Discharge Plan and Services     Post Acute Care Choice: Home Health Living arrangements for the past 2 months: Mobile Home                                      Prior Living  Arrangements/Services Living arrangements for the past 2 months: Mobile Home Lives with:: Parents Patient language and need for interpreter reviewed:: Yes Do you feel safe going back to the place where you live?: Yes      Need for Family Participation in Patient Care: Yes (Comment) Care giver support system in place?: Yes (comment)   Criminal Activity/Legal Involvement Pertinent to Current Situation/Hospitalization: No - Comment as needed  Activities of Daily Living Home Assistive Devices/Equipment: Walker (specify type) ADL Screening (condition at time of admission) Patient's cognitive ability adequate to safely complete daily activities?: Yes Is the patient deaf or have difficulty hearing?: No Does the patient have difficulty seeing, even when wearing glasses/contacts?: No Does the patient have difficulty concentrating, remembering, or making decisions?: No Patient able to express need for assistance with ADLs?: Yes Does the patient have difficulty dressing or bathing?: Yes Independently performs ADLs?: No Communication: Independent with device (comment), Needs assistance Is this a change from baseline?: Pre-admission baseline Dressing (OT): Needs assistance Is this a change from baseline?: Pre-admission baseline Grooming: Needs assistance Is this a change from baseline?: Pre-admission baseline Feeding: Independent Is this a change from baseline?: Pre-admission baseline Bathing: Needs assistance Is this a change from baseline?: Pre-admission baseline Toileting: Needs assistance Is this a change  from baseline?: Change from baseline, expected to last >3days In/Out Bed: Needs assistance Is this a change from baseline?: Pre-admission baseline Walks in Home: Needs assistance Is this a change from baseline?: Pre-admission baseline Does the patient have difficulty walking or climbing stairs?: Yes Weakness of Legs: Both Weakness of Arms/Hands: Both  Permission  Sought/Granted Permission sought to share information with : Facility Industrial/product designer granted to share information with : Yes, Verbal Permission Granted     Permission granted to share info w AGENCY: Home Health Agencies        Emotional Assessment Appearance:: Appears stated age Attitude/Demeanor/Rapport: Engaged, Gracious Affect (typically observed): Accepting, Appropriate, Calm, Pleasant Orientation: : Oriented to Self, Oriented to Place, Oriented to  Time, Oriented to Situation Alcohol / Substance Use: Not Applicable Psych Involvement: No (comment)  Admission diagnosis:  Amniotic fluid leaking [O42.90] Pregnancy [Z34.90] Patient Active Problem List   Diagnosis Date Noted   Pregnancy 11/01/2022   Amniotic fluid leaking 10/27/2022   Labor and delivery, indication for care 10/26/2022   Labor and delivery indication for care or intervention 10/20/2022   Indication for care in labor and delivery, antepartum 10/19/2022   Edema of both lower legs 10/18/2022   Maternal chronic hypertension in third trimester 09/20/2022   Obesity affecting pregnancy in third trimester 07/01/2022   Late prenatal care 07/01/2022   Multigravida of advanced maternal age in second trimester 07/01/2022   Supervision of other high risk pregnancies, unspecified trimester 07/01/2022   Previous cesarean delivery affecting pregnancy 07/01/2022   Chronic hypertension in pregnancy 07/01/2022   Cerebral palsy (HCC) 11/27/2015   Restless leg syndrome 11/27/2015   PCP:  Durenda Hurt, MD Pharmacy:   [facility] [phone] - [place], [place] - [address] [place] [address] Phone: [phone] Fax: [phone]     Social Determinants of Health (SDOH) Social History: SDOH Screenings   Food Insecurity: No Food Insecurity (10/31/2022)  Housing: Low Risk  (10/31/2022)  Transportation Needs: No Transportation Needs (10/31/2022)  Utilities: Not At Risk (10/31/2022)  Tobacco Use: Low Risk   (11/01/2022)   SDOH Interventions:     Readmission Risk Interventions     No data to display

## 2022-11-01 NOTE — Progress Notes (Signed)
I spoke with Janee Morn, CNM concerning patient's mobility individually and with transferring her baby on/out of bed and initiating positioning for feeding. Patient's mother is her primary caregiver. Pt plans to ambulate with walker once she is out of bed post-C/S.  PT and transition of care consults placed.

## 2022-11-02 ENCOUNTER — Other Ambulatory Visit: Payer: Medicaid Other

## 2022-11-02 ENCOUNTER — Encounter: Payer: Self-pay | Admitting: Obstetrics and Gynecology

## 2022-11-02 LAB — CBC
HCT: 29.3 % — ABNORMAL LOW (ref 36.0–46.0)
Hemoglobin: 8.7 g/dL — ABNORMAL LOW (ref 12.0–15.0)
MCH: 23.6 pg — ABNORMAL LOW (ref 26.0–34.0)
MCHC: 29.7 g/dL — ABNORMAL LOW (ref 30.0–36.0)
MCV: 79.6 fL — ABNORMAL LOW (ref 80.0–100.0)
Platelets: 263 10*3/uL (ref 150–400)
RBC: 3.68 MIL/uL — ABNORMAL LOW (ref 3.87–5.11)
RDW: 19.3 % — ABNORMAL HIGH (ref 11.5–15.5)
WBC: 12 10*3/uL — ABNORMAL HIGH (ref 4.0–10.5)
nRBC: 0 % (ref 0.0–0.2)

## 2022-11-02 NOTE — TOC Progression Note (Signed)
Transition of Care [facility]) - Progression Note    Patient Details  Name: Robin Hughes MRN: [id number] Date of Birth: [birth year]  Transition of Care [facility]) CM/SW Contact  Margarito Liner, LCSW Phone Number: 11/02/2022, 12:52 PM  Clinical Narrative:  [facility], [facility], [facility], and [facility] are unable to accept for home health PT. CSW met with patient and her mom. They are agreeable to outpatient PT and prefer [facility] because she has been there in the past. Sent secure chat to MD requesting that he cosign order form. Will fax once signed. Patient confirmed she still does not want DME. Mom said she has a RW that she uses at home.  Expected Discharge Plan: Home w Home Health Services Barriers to Discharge: Continued Medical Work up  Expected Discharge Plan and Services     Post Acute Care Choice: Home Health Living arrangements for the past 2 months: Mobile Home                                       Social Determinants of Health (SDOH) Interventions SDOH Screenings   Food Insecurity: No Food Insecurity (10/31/2022)  Housing: Low Risk  (10/31/2022)  Transportation Needs: No Transportation Needs (10/31/2022)  Utilities: Not At Risk (10/31/2022)  Tobacco Use: Low Risk  (11/02/2022)    Readmission Risk Interventions     No data to display

## 2022-11-02 NOTE — Anesthesia Postprocedure Evaluation (Signed)
Anesthesia Post Note  Patient: Robin Hughes  Procedure(s) Performed: REPEAT CESAREAN SECTION  Patient location during evaluation: Nursing Unit Anesthesia Type: Spinal Level of consciousness: awake Pain management: pain level controlled Respiratory status: spontaneous breathing Cardiovascular status: stable Postop Assessment: no headache Anesthetic complications: no   No notable events documented.   Last Vitals:  Vitals:   11/02/22 0700 11/02/22 0720  BP:    Pulse: 70 77  Resp:    Temp:    SpO2: 93% 95%    Last Pain:  Vitals:   11/02/22 0609  TempSrc:   PainSc: 8                  Jaye Beagle

## 2022-11-02 NOTE — TOC CM/SW Note (Signed)
     [facility] REFERRAL        Occupational Therapy * Physical Therapy * Speech Therapy                           DATE 11/02/2022  PATIENT NAME Robin Hughes PATIENT MRN [id number]       DIAGNOSIS/DIAGNOSIS CODE G80.9  DATE OF DISCHARGE: TBD       PRIMARY CARE PHYSICIAN   Nyoka Lint, MD   PCP PHONE/FAX Phone: [phone]     Dear Provider (Name: [facility]  Fax: [phone]   I certify that I have examined this patient and that occupational/physical/speech therapy is necessary on an outpatient basis.    The patient has expressed interest in completing their recommended course of therapy at your  location.  Once a formal order from the patient's primary care physician has been obtained, please  contact him/her to schedule an appointment for evaluation at your earliest convenience.   [ x]  Physical Therapy Evaluate and Treat  [  ]  Occupational Therapy Evaluate and Treat  [  ]  Speech Therapy Evaluate and Treat         The patient's primary care physician (listed above) must furnish and be responsible for a formal order such that the recommended services may be furnished while under the primary physician's care, and that the plan of care will be established and reviewed every 30 days (or more often if condition necessitates).

## 2022-11-02 NOTE — Progress Notes (Signed)
Progress Note - Cesarean Delivery  Robin Hughes is a 37 y.o. G2P2002 now PP day 1 s/p for CD.   Subjective:  Patient reports no problems with eating, bowel movements, voiding, or their wound  Pain controlled  Pt being seen and followed by OT/PT  Objective:  Vital signs in last 24 hours: Temp:  [97.8 F (36.6 C)-98.9 F (37.2 C)] 98.6 F (37 C) (05/08 0813) Pulse Rate:  [67-89] 72 (05/08 0813) Resp:  [17-20] 20 (05/08 0813) BP: (107-130)/(71-92) 130/92 (05/08 0813) SpO2:  [92 %-98 %] 94 % (05/08 0813)  Physical Exam:  General: alert and cooperative Lochia: appropriate Uterine Fundus: firm Incision: dressing intact    Data Review Recent Labs    10/31/22 2302 11/02/22 0625  HGB 9.7* 8.7*  HCT 31.9* 29.3*    Assessment:  Principal Problem:   Amniotic fluid leaking Active Problems:   Pregnancy   Status post Cesarean section. Doing well postoperatively.     Plan:       Continue current care.  OOB  Pt may shower with assistance  Dressing change today  Elonda Husky, M.D. 11/02/2022 11:25 AM

## 2022-11-02 NOTE — Progress Notes (Signed)
Physical Therapy Treatment Patient Details Name: Robin Hughes MRN: [id number] DOB: 05/09/1986 Today's Date: 11/02/2022   History of Present Illness Pt is a 37 y/o F admitted on 10/31/22 after presenting with c/o fluid leakage (pt [redacted]w[redacted]d pregnant). Pt s/p c-section on 11/01/22 & there are concerns for mobility as pt with hx of CP & mobilizes with RW. PT consult was placed. PMH: CP, chronic HTN, previous cesarean section, depression    PT Comments    Patient received in bed, she wants to take a shower. Patient has family in room. She requires mod A +2 for bed mobility, min A for transfers (sit to stand). Min A +2 for ambulation short distance with RW. Patient did require mod +2-3 for transferring in and out of tub onto shower chair. She is limited by fatigue and pain with mobility. Patient will continue to benefit from skilled PT to improve strength, and functional independence.    Recommendations for follow up therapy are one component of a multi-disciplinary discharge planning process, led by the attending physician.  Recommendations may be updated based on patient status, additional functional criteria and insurance authorization.  Follow Up Recommendations       Assistance Recommended at Discharge Frequent or constant Supervision/Assistance  Patient can return home with the following A lot of help with walking and/or transfers;A lot of help with bathing/dressing/bathroom;Assist for transportation;Help with stairs or ramp for entrance;Assistance with cooking/housework   Equipment Recommendations  Wheelchair (measurements PT);Wheelchair cushion (measurements PT)    Recommendations for Other Services       Precautions / Restrictions Precautions Precautions: Fall Restrictions Weight Bearing Restrictions: No     Mobility  Bed Mobility Overal bed mobility: Needs Assistance Bed Mobility: Rolling, Sidelying to Sit Rolling: Mod assist, +2 for physical assistance Sidelying to sit: Mod  assist, +2 for physical assistance       General bed mobility comments: mod Assist +2 for rolling and cues needed for technique    Transfers Overall transfer level: Needs assistance Equipment used: Rolling walker (2 wheels) Transfers: Sit to/from Stand Sit to Stand: +2 physical assistance, Min assist Stand pivot transfers: Mod assist, +2 physical assistance Step pivot transfers: Mod assist, +2 physical assistance       General transfer comment: min A +2 from bed to stand, Mod +2-3 for stand/step pivot from recliner onto shower chair and to scoot back onto shower chair    Ambulation/Gait Ambulation/Gait assistance: +2 physical assistance, Min assist Gait Distance (Feet): 12 Feet Assistive device: Rolling walker (2 wheels) Gait Pattern/deviations: Step-to pattern, Decreased step length - right, Decreased step length - left, Decreased stride length, Trunk flexed Gait velocity: decreased, effortful     General Gait Details: trunk rotated during gait, decreased step length on right. assist needed to keep AD close to her and cues for upright posture. fatigued after 10-12 feet. Unable to ambulate all the way to shower from bed.   Stairs             Wheelchair Mobility    Modified Rankin (Stroke Patients Only)       Balance Overall balance assessment: Needs assistance Sitting-balance support: Feet supported, Bilateral upper extremity supported Sitting balance-Leahy Scale: Fair     Standing balance support: Bilateral upper extremity supported, During functional activity, Reliant on assistive device for balance Standing balance-Leahy Scale: Fair  Cognition Arousal/Alertness: Awake/alert Behavior During Therapy: WFL for tasks assessed/performed Overall Cognitive Status: Within Functional Limits for tasks assessed                                 General Comments: Pt able to follow commands with slightly extra time  during session.        Exercises      General Comments        Pertinent Vitals/Pain Pain Assessment Pain Assessment: 0-10 Pain Score: 7  Pain Location: c-section incision Pain Descriptors / Indicators: Grimacing, Guarding, Discomfort, Sore Pain Intervention(s): Monitored during session    Home Living                          Prior Function            PT Goals (current goals can now be found in the care plan section) Acute Rehab PT Goals Patient Stated Goal: decreased pain, get better PT Goal Formulation: With patient Time For Goal Achievement: 11/15/22 Potential to Achieve Goals: Fair Progress towards PT goals: Progressing toward goals    Frequency    Min 2X/week      PT Plan Current plan remains appropriate    Co-evaluation PT/OT/SLP Co-Evaluation/Treatment: Yes Reason for Co-Treatment: For patient/therapist safety;To address functional/ADL transfers PT goals addressed during session: Mobility/safety with mobility;Balance;Proper use of DME        AM-PAC PT "6 Clicks" Mobility   Outcome Measure  Help needed turning from your back to your side while in a flat bed without using bedrails?: A Lot Help needed moving from lying on your back to sitting on the side of a flat bed without using bedrails?: A Lot Help needed moving to and from a bed to a chair (including a wheelchair)?: A Lot Help needed standing up from a chair using your arms (e.g., wheelchair or bedside chair)?: A Lot Help needed to walk in hospital room?: A Lot Help needed climbing 3-5 steps with a railing? : Total 6 Click Score: 11    End of Session Equipment Utilized During Treatment: Gait belt Activity Tolerance: Patient limited by pain Patient left: in chair;with family/visitor present Nurse Communication: Mobility status PT Visit Diagnosis: Other abnormalities of gait and mobility (R26.89);Pain;Muscle weakness (generalized) (M62.81);Difficulty in walking, not elsewhere  classified (R26.2) Pain - part of body:  (abdomen)     Time: 1610-9604 PT Time Calculation (min) (ACUTE ONLY): 42 min  Charges:  $Gait Training: 8-22 mins                     Erian Rosengren, PT, GCS 11/02/22,2:44 PM

## 2022-11-02 NOTE — Evaluation (Signed)
Occupational Therapy Evaluation Patient Details Name: Robin Hughes MRN: [id number] DOB: [birth year] Today's Date: 11/02/2022   History of Present Illness Pt is a 37 y/o F admitted on 10/31/22 after presenting with c/o fluid leakage (pt [redacted]w[redacted]d pregnant). Pt s/p c-section on 11/01/22 & there are concerns for mobility as pt with hx of CP & mobilizes with RW. PMH: CP, chronic HTN, previous cesarean section, depression   Clinical Impression   Robin Hughes was seen for OT evaluation this date, seen with PT for safety. Prior to hospital admission, pt was MOD I using RW for mobility, assist for ADLs. Pt lives with mother and 3yo son. Pt currently requires MOD A x2 bed mobility. MIN A x2 + RW for ADL t/f. MAX A x2 chair<>shower chair tub transfer. Pt significantly limited by abdominal pain. Pt would benefit from skilled OT to address noted impairments and functional limitations (see below for any additional details). Upon hospital discharge, recommend follow up therapy and a tub transfer bench.   Recommendations for follow up therapy are one component of a multi-disciplinary discharge planning process, led by the attending physician.  Recommendations may be updated based on patient status, additional functional criteria and insurance authorization.   Assistance Recommended at Discharge Frequent or constant Supervision/Assistance  Patient can return home with the following Two people to help with bathing/dressing/bathroom;Help with stairs or ramp for entrance;A lot of help with walking and/or transfers    Functional Status Assessment  Patient has had a recent decline in their functional status and demonstrates the ability to make significant improvements in function in a reasonable and predictable amount of time.  Equipment Recommendations  Tub/shower bench    Recommendations for Other Services       Precautions / Restrictions Precautions Precautions: Fall;Other (comment)  (abdominal) Restrictions Weight Bearing Restrictions: No      Mobility Bed Mobility Overal bed mobility: Needs Assistance Bed Mobility: Rolling, Sidelying to Sit Rolling: Mod assist, +2 for physical assistance Sidelying to sit: Mod assist, +2 for physical assistance            Transfers Overall transfer level: Needs assistance Equipment used: Rolling walker (2 wheels) Transfers: Sit to/from Stand, Bed to chair/wheelchair/BSC Sit to Stand: Min assist, +2 physical assistance Stand pivot transfers: Mod assist, +2 physical assistance                Balance Overall balance assessment: Needs assistance Sitting-balance support: Feet supported, Bilateral upper extremity supported Sitting balance-Leahy Scale: Fair     Standing balance support: Bilateral upper extremity supported, During functional activity, Reliant on assistive device for balance Standing balance-Leahy Scale: Fair                             ADL either performed or assessed with clinical judgement   ADL Overall ADL's : Needs assistance/impaired                                       General ADL Comments: MAX A don/doff B socks and gown seated on shower chair. MIN A x2+ RW for ADL t/f. MAX A x2 tub transfer using shower chair      Pertinent Vitals/Pain Pain Assessment Pain Assessment: 0-10 Pain Score: 7  Pain Location: c-section incision Pain Descriptors / Indicators: Grimacing, Guarding, Discomfort, Sore Pain Intervention(s): Limited activity within patient's tolerance, Patient requesting pain meds-RN  notified, RN gave pain meds during session     Hand Dominance     Extremity/Trunk Assessment Upper Extremity Assessment Upper Extremity Assessment: Generalized weakness (baseline CP)   Lower Extremity Assessment Lower Extremity Assessment: Generalized weakness       Communication Communication Communication: No difficulties   Cognition Arousal/Alertness:  Awake/alert Behavior During Therapy: WFL for tasks assessed/performed Overall Cognitive Status: Within Functional Limits for tasks assessed                                 General Comments: increased time and repetition to follow cues     General Comments  max HR 116 bpm            Home Living Family/patient expects to be discharged to:: Private residence Living Arrangements: Parent Available Help at Discharge: Family;Available 24 hours/day Type of Home: House Home Access: Stairs to enter [employer] of Steps: 5   Home Layout: One level     Bathroom Shower/Tub: Tub/shower unit         Home Equipment: Agricultural consultant (2 wheels);Wheelchair Financial trader (4 wheels);BSC/3in1   Additional Comments: reports can use walk in shower at brothers house across the street      Prior Functioning/Environment Prior Level of Function : Needs assist             Mobility Comments: Pt was ambulatory with RW until the last 2 weeks where pt has required assistance for bed mobility, transfers, short distance gait. ADLs Comments: mother assists with care for 3yo and plan to care for newborn as well        OT Problem List: Decreased strength;Decreased range of motion;Decreased activity tolerance;Impaired balance (sitting and/or standing);Decreased safety awareness;Pain      OT Treatment/Interventions: Self-care/ADL training;Therapeutic exercise;Energy conservation;DME and/or AE instruction;Therapeutic activities;Patient/family education;Balance training    OT Goals(Current goals can be found in the care plan section) Acute Rehab OT Goals Patient Stated Goal: to go home OT Goal Formulation: With patient/family Time For Goal Achievement: 11/16/22 Potential to Achieve Goals: Fair ADL Goals Pt Will Perform Grooming: with modified independence;sitting Pt Will Perform Lower Body Dressing: with min assist;with adaptive equipment;sit to/from stand Pt Will  Transfer to Toilet: with modified independence;ambulating;regular height toilet  OT Frequency: Min 2X/week    Co-evaluation PT/OT/SLP Co-Evaluation/Treatment: Yes Reason for Co-Treatment: For patient/therapist safety;To address functional/ADL transfers PT goals addressed during session: Mobility/safety with mobility;Balance;Proper use of DME OT goals addressed during session: ADL's and self-care      AM-PAC OT "6 Clicks" Daily Activity     Outcome Measure Help from another person eating meals?: None Help from another person taking care of personal grooming?: A Little Help from another person toileting, which includes using toliet, bedpan, or urinal?: A Lot Help from another person bathing (including washing, rinsing, drying)?: A Lot Help from another person to put on and taking off regular upper body clothing?: A Lot Help from another person to put on and taking off regular lower body clothing?: A Lot 6 Click Score: 15   End of Session    Activity Tolerance: Patient tolerated treatment well;Patient limited by pain Patient left: with call bell/phone within reach;in chair;with nursing/sitter in room;with family/visitor present  OT Visit Diagnosis: Other abnormalities of gait and mobility (R26.89);Muscle weakness (generalized) (M62.81);Pain Pain - part of body:  (abdomen)                Time: 2130-8657 OT  Time Calculation (min): 46 min Charges:  OT General Charges $OT Visit: 1 Visit OT Evaluation $OT Eval Moderate Complexity: 1 Mod OT Treatments $Self Care/Home Management : 8-22 mins  Kathie Dike, M.S. OTR/L  11/02/22, 3:42 PM  ascom [phone]

## 2022-11-03 LAB — SURGICAL PATHOLOGY

## 2022-11-03 MED ORDER — ESCITALOPRAM OXALATE 10 MG PO TABS
20.0000 mg | ORAL_TABLET | Freq: Every day | ORAL | Status: DC
  Administered 2022-11-03 – 2022-11-04 (×2): 20 mg via ORAL
  Filled 2022-11-03 (×2): qty 2

## 2022-11-03 MED ORDER — GABAPENTIN 100 MG PO CAPS
100.0000 mg | ORAL_CAPSULE | Freq: Two times a day (BID) | ORAL | Status: DC
  Administered 2022-11-03 – 2022-11-04 (×2): 100 mg via ORAL
  Filled 2022-11-03 (×2): qty 1

## 2022-11-03 NOTE — Progress Notes (Signed)
Pt seen by OT  and PT,  was recommended In patient rehab at [facility] pt declined. Pt had expressed interest in going home today, Ped's called, they do not feel discharge this late at night without follow up scheduled is appropriate.  Plan to discharge tomorrow.  Carie Caddy, CNM  [facility]  11/02/2022 10:02 PM

## 2022-11-03 NOTE — Progress Notes (Signed)
Obstetric Postpartum Daily Progress Note Subjective:  37 y.o. [id number] postpartum day #2 status post RLTCS with BTL.  She is not ambulating, has gotten up the shower or chair with assist- PT/OT has seen and evaluated pt. is tolerating po, is voiding spontaneously.  Her pain is well controlled on PO pain medications. Has some cramping, resolved with Percocet.  Her lochia is less than menses. Reports mood is good. Is bottle feeding. Feels ready to go home, her mother and father live with her, her brother lives across the street and they have other family members that live nearby.    Medications SCHEDULED MEDICATIONS   escitalopram  20 mg Oral Daily   ibuprofen  600 mg Oral Q6H   prenatal multivitamin  1 tablet Oral Q1200   scopolamine  1 patch Transdermal Once   senna-docusate  2 tablet Oral Q24H   simethicone  80 mg Oral QID    MEDICATION INFUSIONS   lactated ringers 125 mL/hr at 11/01/22 1501   lactated ringers Stopped (11/02/22 0000)   naloxone HCl (NARCAN) 2 mg in dextrose 5 % 250 mL infusion      PRN MEDICATIONS  diphenhydrAMINE, menthol-cetylpyridinium, naloxone **AND** sodium chloride flush, naloxone HCl (NARCAN) 2 mg in dextrose 5 % 250 mL infusion, ondansetron (ZOFRAN) IV, oxyCODONE, oxyCODONE-acetaminophen, zolpidem    Objective:   Vitals:   11/02/22 2130 11/03/22 0029 11/03/22 0435 11/03/22 0831  BP: 117/65 121/68 119/76 130/85  Pulse: 80 78 68 74  Resp: 18 18 20 17   Temp: 98.5 F (36.9 C) 98.2 F (36.8 C) 99 F (37.2 C) 98.4 F (36.9 C)  TempSrc: Oral Oral  Oral  SpO2: 92% 97% 94% 95%  Weight:      Height:        Current Vital Signs 24h Vital Sign Ranges  T 98.4 F (36.9 C) Temp  Avg: 98.5 F (36.9 C)  Min: 98.2 F (36.8 C)  Max: 99 F (37.2 C)  BP 130/85 BP  Min: 117/65  Max: 130/85  HR 74 Pulse  Avg: 75.6  Min: 68  Max: 80  RR 17 Resp  Avg: 18.6  Min: 17  Max: 20  SaO2 95 % Room Air SpO2  Avg: 94.8 %  Min: 92 %  Max: 97 %       24 Hour I/O Current Shift  I/O  Time Ins Outs 05/08 0701 - 05/09 0700 In: -  Out: 1325 [Urine:1325] No intake/output data recorded.  General: NAD Pulmonary: no increased work of breathing, CTAB  Heart: RRR, no skips gallops or murmurs Abdomen: non-distended, slightly tender, fundus firm at level of umbilicus Incision: honeycomb dressing dry and intact  Extremities: trace to +1 edema, no erythema, no tenderness  Labs:  Recent Labs  Lab 10/31/22 2302 11/02/22 0625  WBC 9.6 12.0*  HGB 9.7* 8.7*  HCT 31.9* 29.3*  PLT 280 263     Assessment:   37 y.o. G2P2002 postpartum day # 2 status post RLTCS with BTL   Plan:   1) Acute blood loss anemia - hemodynamically stable and asymptomatic - po ferrous sulfate  2) A POS / Rubella 2.05 (01/05 1452)/ Varicella Immune  3) TDAP status not given prenatally   4) bottle /Contraception = bilateral tubal ligation  5) Disposition anticipate discharge later today. Pt needs evaluation with OT/PT to determine readiness to go home, pt required assist x4 to use the shower. Pt mobility significantly impaired. There are plans for outpatient PT.   6)hx anxiety and  depression, Lexapro reordered   Ellouise Newer [facility], CNM  11/03/2022 9:37 AM

## 2022-11-03 NOTE — Progress Notes (Signed)
Physical Therapy Treatment Patient Details Name: Robin Hughes MRN: [id number] DOB: December 05, 1985 Today's Date: 11/03/2022   History of Present Illness Pt is a 37 y/o F admitted on 10/31/22 after presenting with c/o fluid leakage (pt [redacted]w[redacted]d pregnant). Pt s/p c-section on 11/01/22 & there are concerns for mobility as pt with hx of CP & mobilizes with RW. PMH: CP, chronic HTN, previous cesarean section, depression    PT Comments    Patient alert, agreeable to PT, reported 7/10 abdominal pain. Pt requiring maxA for bed mobility and modAx2 (for safety) for all standing and ambulation attempts. Pt very challenged by all mobility attempts, limited by pain and weakness. Unable to truly ambulate, however pt remains very motivated, family present throughout. Discussion of discharge plans and safety barriers, pt/family to talk over their options. PT recommendation updated to maximize therapy services (>3hours a day) to return pt to PLOF. If pt is to discharge home 24/7 physical assistance recommended as well as a wheelchair.     Recommendations for follow up therapy are one component of a multi-disciplinary discharge planning process, led by the attending physician.  Recommendations may be updated based on patient status, additional functional criteria and insurance authorization.  Follow Up Recommendations       Assistance Recommended at Discharge Frequent or constant Supervision/Assistance  Patient can return home with the following Two people to help with walking and/or transfers;Two people to help with bathing/dressing/bathroom;Assistance with cooking/housework;Assistance with feeding;Direct supervision/assist for medications management;Assist for transportation;Help with stairs or ramp for entrance   Equipment Recommendations  Wheelchair (measurements PT);Wheelchair cushion (measurements PT)    Recommendations for Other Services       Precautions / Restrictions Precautions Precautions: Fall;Other  (comment) Precaution Comments: abdominal incision Restrictions Weight Bearing Restrictions: No     Mobility  Bed Mobility Overal bed mobility: Needs Assistance Bed Mobility: Rolling, Sidelying to Sit Rolling: Max assist Sidelying to sit: Max assist       General bed mobility comments: extended time    Transfers Overall transfer level: Needs assistance Equipment used: Rolling walker (2 wheels) Transfers: Sit to/from Stand, Bed to chair/wheelchair/BSC Sit to Stand: Min assist   Step pivot transfers: Min assist            Ambulation/Gait Ambulation/Gait assistance: Min assist, +2 safety/equipment, Mod assist Gait Distance (Feet): 2 Feet Assistive device: Rolling walker (2 wheels)   Gait velocity: decreased, effortful     General Gait Details: unable to take more than 2-3 steps today, noted for increasing trunk flexion, RW outside BOS, pt endorsed needing to sit   Stairs             Wheelchair Mobility    Modified Rankin (Stroke Patients Only)       Balance Overall balance assessment: Needs assistance Sitting-balance support: Feet supported, Bilateral upper extremity supported Sitting balance-Leahy Scale: Fair     Standing balance support: Bilateral upper extremity supported, During functional activity, Reliant on assistive device for balance Standing balance-Leahy Scale: Poor Standing balance comment: maxA for pericare                            Cognition Arousal/Alertness: Awake/alert Behavior During Therapy: WFL for tasks assessed/performed Overall Cognitive Status: Within Functional Limits for tasks assessed  Exercises      General Comments        Pertinent Vitals/Pain Pain Assessment Pain Assessment: 0-10 Pain Score: 7  Pain Location: c-section incision Pain Descriptors / Indicators: Grimacing, Guarding, Discomfort, Sore Pain Intervention(s): Limited activity  within patient's tolerance, Monitored during session, Repositioned    Home Living                          Prior Function            PT Goals (current goals can now be found in the care plan section) Progress towards PT goals: Progressing toward goals    Frequency    Min 4X/week      PT Plan Discharge plan needs to be updated;Frequency needs to be updated    Co-evaluation              AM-PAC PT "6 Clicks" Mobility   Outcome Measure  Help needed turning from your back to your side while in a flat bed without using bedrails?: A Lot Help needed moving from lying on your back to sitting on the side of a flat bed without using bedrails?: A Lot Help needed moving to and from a bed to a chair (including a wheelchair)?: A Lot Help needed standing up from a chair using your arms (e.g., wheelchair or bedside chair)?: A Lot Help needed to walk in hospital room?: A Lot Help needed climbing 3-5 steps with a railing? : Total 6 Click Score: 11    End of Session   Activity Tolerance: Patient limited by pain Patient left: in chair;with family/visitor present Nurse Communication: Mobility status PT Visit Diagnosis: Other abnormalities of gait and mobility (R26.89);Pain;Muscle weakness (generalized) (M62.81);Difficulty in walking, not elsewhere classified (R26.2) Pain - Right/Left:  (midline) Pain - part of body:  (abdomen)     Time: 1610-9604 PT Time Calculation (min) (ACUTE ONLY): 25 min  Charges:  $Therapeutic Activity: 23-37 mins                     Olga Coaster PT, DPT 12:29 PM,11/03/22

## 2022-11-03 NOTE — Progress Notes (Addendum)
Inpatient Rehab Admissions Coordinator Note:   Per therapy recommendations patient was screened for CIR candidacy by Stephania Fragmin, PT. At this time, pt appears to be a potential candidate for CIR. I will place an order for rehab consult for full assessment, per our protocol.  Please contact me any with questions.. Note that MD documenting pt prefers to go home.  If she does not wish to pursue CIR please feel free to d/c order.   Estill Dooms, PT, DPT [phone] 11/03/22 1:14 PM

## 2022-11-03 NOTE — Progress Notes (Signed)
Occupational Therapy Treatment Patient Details Name: KADY ODONOHUE MRN: [id number] DOB: 1986/02/18 Today's Date: 11/03/2022   History of present illness Pt is a 37 y/o F admitted on 10/31/22 after presenting with c/o fluid leakage (pt [redacted]w[redacted]d pregnant). Pt s/p c-section on 11/01/22 & there are concerns for mobility as pt with hx of CP & mobilizes with RW. PMH: CP, chronic HTN, previous cesarean section, depression   OT comments Pt seen for OT treatment on this date. Upon arrival to room pt seated in recliner, agreeable to tx. Pt requires MIN A x2 during transferring: pt donned briefs x2 with MAX A. +2 for physical assistance during transfer to bed side commode; Pt stood while MAX A  provided for wiping & donning brief. Functional mobility during ADLs: Moderate assistance. Pt making progress toward goals, will continue to follow POC. Discharge recommendation remains appropriate. Pt will require 24/7 assistance upon d/c including physical assist to complete toileting and dressing. Tub transfer bench recommended.     Recommendations for follow up therapy are one component of a multi-disciplinary discharge planning process, led by the attending physician.  Recommendations may be updated based on patient status, additional functional criteria and insurance authorization.    Assistance Recommended at Discharge Frequent or constant Supervision/Assistance  Patient can return home with the following  Two people to help with bathing/dressing/bathroom;Help with stairs or ramp for entrance;A lot of help with walking and/or transfers   Equipment Recommendations  Tub/shower bench    Recommendations for Other Services      Precautions / Restrictions Precautions Precautions: Fall;Other (comment) (abdominal) Precaution Comments: abdominal incision Restrictions Weight Bearing Restrictions: No       Mobility Bed Mobility Overal bed mobility: Needs Assistance Bed Mobility: Sit to Supine       Sit to  supine: Mod assist        Transfers Overall transfer level: Needs assistance Equipment used: Rolling walker (2 wheels) Transfers: Sit to/from Stand Sit to Stand: Min assist           General transfer comment: MIN A +2 from chair/bedside comode to stand.     Balance Overall balance assessment: Needs assistance Sitting-balance support: Feet supported, Bilateral upper extremity supported Sitting balance-Leahy Scale: Fair     Standing balance support: Bilateral upper extremity supported, During functional activity, Reliant on assistive device for balance Standing balance-Leahy Scale: Poor Standing balance comment: maxA for pericare                           ADL either performed or assessed with clinical judgement   ADL Overall ADL's : Needs assistance/impaired                     Lower Body Dressing: Maximal assistance Lower Body Dressing Details (indicate cue type and reason): pt donned briefs x2 with MAX A. Toilet Transfer: +2 for physical assistance;Minimal assistance;Rolling walker (2 wheels)   Toileting- Clothing Manipulation and Hygiene: Maximal assistance Toileting - Clothing Manipulation Details (indicate cue type and reason): Pt stood while MAX A  provided for wiping & donning brief     Functional mobility during ADLs: Moderate assistance      Extremity/Trunk Assessment              Vision       Perception     Praxis      Cognition Arousal/Alertness: Awake/alert Behavior During Therapy: WFL for tasks assessed/performed Overall Cognitive Status: Within Functional Limits  for tasks assessed                                 General Comments: increased time and repetition to follow cues        Exercises      Shoulder Instructions       General Comments      Pertinent Vitals/ Pain       Pain Assessment Pain Assessment: 0-10 Pain Score: 4   Home Living                                           Prior Functioning/Environment              Frequency  Min 2X/week        Progress Toward Goals  OT Goals(current goals can now be found in the care plan section)  Progress towards OT goals: Progressing toward goals  Acute Rehab OT Goals Patient Stated Goal: to go home OT Goal Formulation: With patient/family Time For Goal Achievement: 11/16/22 Potential to Achieve Goals: Fair ADL Goals Pt Will Perform Grooming: with modified independence;sitting Pt Will Perform Lower Body Dressing: with min assist;with adaptive equipment;sit to/from stand Pt Will Transfer to Toilet: with modified independence;ambulating;regular height toilet  Plan Discharge plan remains appropriate;Frequency remains appropriate    Co-evaluation          OT goals addressed during session: ADL's and self-care      AM-PAC OT "6 Clicks" Daily Activity     Outcome Measure   Help from another person eating meals?: None Help from another person taking care of personal grooming?: A Little Help from another person toileting, which includes using toliet, bedpan, or urinal?: A Lot Help from another person bathing (including washing, rinsing, drying)?: A Lot Help from another person to put on and taking off regular upper body clothing?: A Lot Help from another person to put on and taking off regular lower body clothing?: A Lot 6 Click Score: 15    End of Session Equipment Utilized During Treatment: Gait belt;Rolling walker (2 wheels)  OT Visit Diagnosis: Other abnormalities of gait and mobility (R26.89);Muscle weakness (generalized) (M62.81);Pain   Activity Tolerance Patient tolerated treatment well;Patient limited by fatigue   Patient Left in bed;with call bell/phone within reach;with nursing/sitter in room;with family/visitor present   Nurse Communication Mobility status        Time: 1610-9604 OT Time Calculation (min): 25 min  Charges: OT General Charges $OT Visit: 1 Visit OT  Treatments $Self Care/Home Management : 23-37 mins  Thresa Ross, OTS

## 2022-11-03 NOTE — Progress Notes (Signed)
  Inpatient Rehabilitation Admissions Coordinator   I spoke with patient by phone for rehab assessment. We discussed goals and expectations of a possible CIR admit at [facility] in [place]. She states she does not want Cir admit, wants to go home with her parents, 36 year old and infant. I will alert acute team and TOC. We will not pursue Cir admit at this time. Please call me with any questions.   Ottie Glazier, RN, MSN Rehab Admissions Coordinator [phone]

## 2022-11-04 MED ORDER — ONDANSETRON HCL 4 MG PO TABS
4.0000 mg | ORAL_TABLET | Freq: Four times a day (QID) | ORAL | Status: DC | PRN
  Administered 2022-11-04: 4 mg via ORAL
  Filled 2022-11-04: qty 1

## 2022-11-04 MED ORDER — GABAPENTIN 300 MG PO CAPS: 300.0000 mg | ORAL_CAPSULE | Freq: Two times a day (BID) | ORAL | 0 refills | Status: AC

## 2022-11-04 NOTE — TOC Transition Note (Addendum)
Transition of Care [facility]) - CM/SW Discharge Note   Patient Details  Name: Robin Hughes MRN: [id number] Date of Birth: [birth year]  [facility]) CM/SW Contact:  Darolyn Rua, LCSW Phone Number: 11/04/2022, 9:20 AM   Clinical Narrative  Patient to discharge home today, patient confirms having wheel chair at home. She is in agreement to recommendation made by PT for [facility] transport home, patient's mother Britta Mccreedy also aware. Confirmed address in chart is accurate however she reports she will be going to [address]   Patient is set up with Outpatient PT with [facility] per patient preference as she has been there in the past, referral has been faxed to [facility] PT at [phone].   Patient and mother Britta Mccreedy express no further discharge needs at this time.   Final next level of care:  (oupatient PT) Barriers to Discharge: No Barriers Identified   Patient Goals and CMS Choice CMS Medicare.gov Compare Post Acute Care list provided to:: Patient Choice offered to / list presented to : Patient  Discharge Placement                  Patient to be transferred to facility by: [facility] Name of family member notified: mother Britta Mccreedy Patient and family notified of of transfer: 11/04/22  Discharge Plan and Services Additional resources added to the After Visit Summary for       Post Acute Care Choice: Home Health                               Social Determinants of Health (SDOH) Interventions SDOH Screenings   Food Insecurity: No Food Insecurity (10/31/2022)  Housing: Low Risk  (10/31/2022)  Transportation Needs: No Transportation Needs (10/31/2022)  Utilities: Not At Risk (10/31/2022)  Tobacco Use: Low Risk  (11/02/2022)     Readmission Risk Interventions     No data to display

## 2022-11-04 NOTE — TOC Progression Note (Addendum)
Transition of Care [facility]) - Progression Note    Patient Details  Name: Robin Hughes MRN: [id number] Date of Birth: [birth year]  Transition of Care [facility]) CM/SW Contact  Darleene Cleaver, [place] Phone Number: 11/04/2022, 4:35 PM  Clinical Narrative:     CSW spoke to [facility], dispatcher said EMS should be arriving now to transport patient.  Expected Discharge Plan: Home w Home Health Services Barriers to Discharge: No Barriers Identified  Expected Discharge Plan and Services     Post Acute Care Choice: Home Health Living arrangements for the past 2 months: Mobile Home Expected Discharge Date: 11/04/22                                     Social Determinants of Health (SDOH) Interventions SDOH Screenings   Food Insecurity: No Food Insecurity (10/31/2022)  Housing: Low Risk  (10/31/2022)  Transportation Needs: No Transportation Needs (10/31/2022)  Utilities: Not At Risk (10/31/2022)  Tobacco Use: Low Risk  (11/02/2022)    Readmission Risk Interventions     No data to display

## 2022-11-04 NOTE — Progress Notes (Signed)
Occupational Therapy Treatment Patient Details Name: Robin Hughes MRN: [id number] DOB: Aug 13, 1985 Today's Date: 11/04/2022   History of present illness Pt is a 37 y/o F admitted on 10/31/22 after presenting with c/o fluid leakage (pt [redacted]w[redacted]d pregnant). Pt s/p c-section on 11/01/22 & there are concerns for mobility as pt with hx of CP & mobilizes with RW. PMH: CP, chronic HTN, previous cesarean section, depression   OT comments  Robin Hughes was seen for OT treatment on this date. Upon arrival to room pt seated in chair, agreeable to tx. Pt requires MAX A don/doff B shoes in sitting. CGA + RW for toilet t/f, tolerated ~50 ft, x2 LOBs noted requiring MIN A to correct. MAX A pericare and clothing mgmt standing. Pt making good progress toward goals, will continue to follow POC. Discharge recommendation remains appropriate.     Recommendations for follow up therapy are one component of a multi-disciplinary discharge planning process, led by the attending physician.  Recommendations may be updated based on patient status, additional functional criteria and insurance authorization.    Assistance Recommended at Discharge Frequent or constant Supervision/Assistance  Patient can return home with the following  Two people to help with bathing/dressing/bathroom;Help with stairs or ramp for entrance;A lot of help with walking and/or transfers   Equipment Recommendations  Tub/shower bench    Recommendations for Other Services      Precautions / Restrictions Precautions Precautions: Fall Precaution Comments: abdominal incision Restrictions Weight Bearing Restrictions: No       Mobility Bed Mobility Overal bed mobility: Needs Assistance Bed Mobility: Sit to Supine       Sit to supine: Mod assist        Transfers Overall transfer level: Needs assistance Equipment used: Rolling walker (2 wheels) Transfers: Sit to/from Stand Sit to Stand: Min guard                 Balance Overall  balance assessment: Needs assistance Sitting-balance support: Feet supported, Bilateral upper extremity supported Sitting balance-Leahy Scale: Fair     Standing balance support: Bilateral upper extremity supported, During functional activity, Reliant on assistive device for balance Standing balance-Leahy Scale: Poor                             ADL either performed or assessed with clinical judgement   ADL Overall ADL's : Needs assistance/impaired                                       General ADL Comments: MAX A don/doff B shoes in sitting.  MIN A + RW for toilet t/f. MAX A pericare and clothing mgmt standing.      Cognition Arousal/Alertness: Awake/alert Behavior During Therapy: WFL for tasks assessed/performed Overall Cognitive Status: Within Functional Limits for tasks assessed                                                     Pertinent Vitals/ Pain       Pain Assessment Pain Assessment: 0-10 Pain Score: 3  Pain Location: c-section incision Pain Descriptors / Indicators: Discomfort, Sore Pain Intervention(s): Limited activity within patient's tolerance, Repositioned   Frequency  Min 2X/week  Progress Toward Goals  OT Goals(current goals can now be found in the care plan section)  Progress towards OT goals: Progressing toward goals  Acute Rehab OT Goals Patient Stated Goal: to go home OT Goal Formulation: With patient/family Time For Goal Achievement: 11/16/22 Potential to Achieve Goals: Fair ADL Goals Pt Will Perform Grooming: with modified independence;sitting Pt Will Perform Lower Body Dressing: with min assist;with adaptive equipment;sit to/from stand Pt Will Transfer to Toilet: with modified independence;ambulating;regular height toilet  Plan Discharge plan remains appropriate;Frequency remains appropriate    Co-evaluation                 AM-PAC OT "6 Clicks" Daily Activity     Outcome  Measure   Help from another person eating meals?: None Help from another person taking care of personal grooming?: A Little Help from another person toileting, which includes using toliet, bedpan, or urinal?: A Lot Help from another person bathing (including washing, rinsing, drying)?: A Little Help from another person to put on and taking off regular upper body clothing?: A Lot Help from another person to put on and taking off regular lower body clothing?: A Lot 6 Click Score: 16    End of Session    OT Visit Diagnosis: Other abnormalities of gait and mobility (R26.89);Muscle weakness (generalized) (M62.81);Pain   Activity Tolerance Patient tolerated treatment well;Patient limited by fatigue   Patient Left in bed;with call bell/phone within reach;with nursing/sitter in room;with family/visitor present   Nurse Communication Mobility status        Time: 1132-1150 OT Time Calculation (min): 18 min  Charges: OT General Charges $OT Visit: 1 Visit OT Treatments $Self Care/Home Management : 8-22 mins  Kathie Dike, M.S. OTR/L  11/04/22, 1:18 PM  ascom [phone]

## 2022-11-04 NOTE — Progress Notes (Signed)
Physical Therapy Treatment Patient Details Name: Robin Hughes MRN: [id number] DOB: [birth year] Today's Date: 11/04/2022   History of Present Illness Pt is a 37 y/o F admitted on 10/31/22 after presenting with c/o fluid leakage (pt [redacted]w[redacted]d pregnant). Pt s/p c-section on 11/01/22 & there are concerns for mobility as pt with hx of CP & mobilizes with RW. PMH: CP, chronic HTN, previous cesarean section, depression    PT Comments    Patient alert, agreeable to PT, seated in recliner at start of session. Pt demonstrated good progress in mobility today. Sit <> stand with CGA and pt able to ambulate ~92ft with close chair follow, CGA. After a seated rest break she ambulated ~6ft. Pt/PT discussed WC use, safety with mobility (needing phyiscal assistance for safety/deconditioning) and pt agreeable. The patient would benefit from further skilled PT intervention to continue to progress towards goals.     Recommendations for follow up therapy are one component of a multi-disciplinary discharge planning process, led by the attending physician.  Recommendations may be updated based on patient status, additional functional criteria and insurance authorization.  Follow Up Recommendations       Assistance Recommended at Discharge Frequent or constant Supervision/Assistance  Patient can return home with the following Two people to help with walking and/or transfers;Two people to help with bathing/dressing/bathroom;Assistance with cooking/housework;Assistance with feeding;Direct supervision/assist for medications management;Assist for transportation;Help with stairs or ramp for entrance   Equipment Recommendations  Wheelchair (measurements PT);Wheelchair cushion (measurements PT)    Recommendations for Other Services       Precautions / Restrictions Precautions Precautions: Fall Precaution Comments: abdominal incision Restrictions Weight Bearing Restrictions: No     Mobility  Bed Mobility                General bed mobility comments: pt up in chair at start/end of session    Transfers Overall transfer level: Needs assistance Equipment used: Rolling walker (2 wheels) Transfers: Sit to/from Stand Sit to Stand: Min guard                Ambulation/Gait Ambulation/Gait assistance: Min guard, Min assist Gait Distance (Feet):  (17ft, 2ft) Assistive device: Rolling walker (2 wheels) Gait Pattern/deviations: Step-to pattern, Decreased step length - right, Decreased step length - left, Decreased stride length, Trunk flexed       General Gait Details: unable to take more than 2-3 steps today, noted for increasing trunk flexion, RW outside BOS, pt endorsed needing to sit   Stairs             Wheelchair Mobility    Modified Rankin (Stroke Patients Only)       Balance Overall balance assessment: Needs assistance Sitting-balance support: Feet supported, Bilateral upper extremity supported Sitting balance-Leahy Scale: Fair     Standing balance support: Bilateral upper extremity supported, During functional activity, Reliant on assistive device for balance Standing balance-Leahy Scale: Poor                              Cognition Arousal/Alertness: Awake/alert Behavior During Therapy: WFL for tasks assessed/performed Overall Cognitive Status: Within Functional Limits for tasks assessed                                          Exercises      General Comments  Pertinent Vitals/Pain Pain Assessment Pain Assessment: 0-10 Pain Score: 3  Pain Location: c-section incision Pain Descriptors / Indicators: Discomfort, Sore Pain Intervention(s): Limited activity within patient's tolerance, Repositioned    Home Living                          Prior Function            PT Goals (current goals can now be found in the care plan section) Progress towards PT goals: Progressing toward goals    Frequency    Min  4X/week      PT Plan Discharge plan needs to be updated    Co-evaluation              AM-PAC PT "6 Clicks" Mobility   Outcome Measure  Help needed turning from your back to your side while in a flat bed without using bedrails?: A Lot Help needed moving from lying on your back to sitting on the side of a flat bed without using bedrails?: A Little Help needed moving to and from a bed to a chair (including a wheelchair)?: A Little Help needed standing up from a chair using your arms (e.g., wheelchair or bedside chair)?: A Little Help needed to walk in hospital room?: A Lot Help needed climbing 3-5 steps with a railing? : Total 6 Click Score: 14    End of Session Equipment Utilized During Treatment: Gait belt Activity Tolerance: Patient limited by pain Patient left: in chair;with family/visitor present Nurse Communication: Mobility status PT Visit Diagnosis: Other abnormalities of gait and mobility (R26.89);Pain;Muscle weakness (generalized) (M62.81);Difficulty in walking, not elsewhere classified (R26.2) Pain - Right/Left:  (midline) Pain - part of body:  (abdomen)     Time: 1610-9604 PT Time Calculation (min) (ACUTE ONLY): 19 min  Charges:  $Therapeutic Activity: 8-22 mins                     Olga Coaster PT, DPT 1:08 PM,11/04/22

## 2022-11-04 NOTE — TOC Progression Note (Addendum)
Transition of Care [facility]) - Progression Note    Patient Details  Name: Robin Hughes MRN: [id number] Date of Birth: 03/26/1986  Transition of Care [facility]) CM/SW Contact  Darleene Cleaver, [place] Phone Number: 11/04/2022, 10:04 AM  Clinical Narrative:     CSW was informed that outpatient therapy orders need to be cosigned by MD.  CSW sent message to attending physician Dr. Logan Bores asking him to cosign the note so patient can be set up with outpatient.   Expected Discharge Plan: Home w Home Health Services Barriers to Discharge: No Barriers Identified  Expected Discharge Plan and Services     Post Acute Care Choice: Home Health Living arrangements for the past 2 months: Mobile Home                                       Social Determinants of Health (SDOH) Interventions SDOH Screenings   Food Insecurity: No Food Insecurity (10/31/2022)  Housing: Low Risk  (10/31/2022)  Transportation Needs: No Transportation Needs (10/31/2022)  Utilities: Not At Risk (10/31/2022)  Tobacco Use: Low Risk  (11/02/2022)    Readmission Risk Interventions     No data to display

## 2022-11-04 NOTE — Discharge Summary (Signed)
OB Discharge Summary     Patient Name: Robin Hughes DOB: 11-19-1985 MRN: [id number]  Date of admission: 10/31/2022 Delivering MD: Raeford Razor, CNM  Date of Delivery: 11/04/2022  Date of discharge: 11/04/2022  Admitting diagnosis: Amniotic fluid leaking [O42.90] Pregnancy [Z34.90] Intrauterine pregnancy: [redacted]w[redacted]d     Secondary diagnosis:  cerebral palsy, chronic hypertension, previous c/s, AMA, depression.     Discharge diagnosis: Term Pregnancy Delivered, CHTN, and Anemia                                                                                                Post partum procedures: Has received physical therapy  Augmentation: Had repeat c/s following SROM, had planned repeat c/s with tubal.  Complications: None  Hospital course:  Sceduled C/S   37 y.o. yo G2P2002 at [redacted]w[redacted]d was admitted to the hospital 10/31/2022 for scheduled cesarean section with the following indication:Elective Repeat.Delivery details are as follows:  Membrane Rupture Time/Date: 9:00 PM ,10/31/2022   Delivery Method:C-Section, Low Vertical  Details of operation can be found in separate operative note.  Patient had a postpartum course complicated by decreased ADLs, although this was anticipated. She did received physical therapy while in patient. PT recommended rehab facility although patient declined. She has been able to ambulate with walker yesterday and today with help. She agreed to out patient PT which was ordered by PT. Also social work will assisted with arranging EMS transport home to help her get into her home due to current physical limitations.  She is tolerating a regular diet, passing flatus, and urinating well. Is bottle feeding per choice, does not want to breastfeed. Pain well controlled with gabapentin, ibuprofen and tylenol. Has not needed oxycodone today. Patient has previously taken gabapentin for pain and feels it works better for her than oxycodone, is requesting Rx for 300mg  BID, as she has  previously taken it at this dose and it was effective. Also reports light bleeding, denies dizziness or lightheadedness. Patient is discharged home in stable condition on  11/04/22        Newborn Data: Birth date:11/01/2022  Birth time:6:50 AM  Gender:Female  Living status:Living  Apgars:7 ,9  Weight:3290 g     Subjective:   Physical exam  Vitals:   11/03/22 1956 11/04/22 0013 11/04/22 0302 11/04/22 0719  BP: 136/75 137/81 124/70 128/87  Pulse: 72 71 62 70  Resp: 18 18 18 17   Temp: 98.3 F (36.8 C) 98.5 F (36.9 C) 98.5 F (36.9 C) 98.4 F (36.9 C)  TempSrc: Oral Oral Oral Oral  SpO2: 96% 95% 95% 95%  Weight:      Height:       General: alert, cooperative, and no distress Breast: soft, non-tender, nipples without breakdown Lochia: appropriate Uterine Fundus: firm LT incision with honeycomb dressing, no surrounding edema/erythema/exudate Perineum: no erythema or foul odor discharge, minimal edema DVT Evaluation: No evidence of DVT seen on physical exam.  Labs: Lab Results  Component Value Date   WBC 12.0 (H) 11/02/2022   HGB 8.7 (L) 11/02/2022   HCT 29.3 (L) 11/02/2022   MCV 79.6 (  L) 11/02/2022   PLT 263 11/02/2022    Discharge instruction: in After Visit Summary.  Medications:  Allergies as of 11/04/2022   No Known Allergies      Medication List     TAKE these medications    escitalopram 20 MG tablet Commonly known as: LEXAPRO Take 20 mg by mouth daily.   ferrous sulfate 325 (65 FE) MG EC tablet Take 1 tablet (325 mg total) by mouth every other day.   gabapentin 300 MG capsule Commonly known as: Neurontin Take 1 capsule (300 mg total) by mouth 2 (two) times daily for 14 days.   prenatal vitamin w/FE, FA 27-1 MG Tabs tablet Take 1 tablet by mouth daily at 12 noon.         Activity: Advance as tolerated. Pelvic rest for 6 weeks. Outpatient PT ordered.  Oral Fe for anemia planned. Had IV Fe infusion on 10/18/22.  Outpatient follow up:   Follow-up Information     Linzie Collin, MD Follow up.   Specialties: Obstetrics and Gynecology, Radiology Why: call to make a in person follow up visit and two and six weeks postpartum Contact information: [address] [place] [address] [phone]                   Postpartum contraception: Tubal Ligation Rhogam Given postpartum: not indicated Rubella vaccine given postpartum: not indicated Varicella vaccine given postpartum: not indicated TDaP given antepartum or postpartum: declined  Newborn Data: Live born female  Birth Weight: 7 lb 4.1 oz (3290 g) APGAR: 7, 9  Newborn Delivery   Birth date/time: 11/01/2022 06:50:33 Delivery type: C-Section, Low Vertical Trial of labor: No C-section categorization: Repeat       Baby Feeding: Bottle  Disposition:home with mother   Raeford Razor, CNM, FNP 11/04/2022 1:06 PM

## 2022-11-04 NOTE — Progress Notes (Signed)
Pt discharged with infant. Patient transported via EMS. Discharge instructions, prescriptions and follow up appointment given to and reviewed with pt. Pt verbalized understanding. Escorted out by EMS

## 2022-11-07 ENCOUNTER — Inpatient Hospital Stay: Admission: RE | Admit: 2022-11-07 | Payer: Medicaid Other | Source: Ambulatory Visit

## 2022-11-09 ENCOUNTER — Other Ambulatory Visit: Payer: Self-pay

## 2022-11-11 ENCOUNTER — Inpatient Hospital Stay: Admit: 2022-11-11 | Payer: Medicaid Other | Admitting: Obstetrics and Gynecology

## 2022-11-16 ENCOUNTER — Telehealth: Payer: Self-pay

## 2022-11-22 ENCOUNTER — Encounter: Payer: Medicaid Other | Admitting: Obstetrics and Gynecology

## 2022-11-22 DIAGNOSIS — Z9889 Other specified postprocedural states: Secondary | ICD-10-CM

## 2022-11-30 ENCOUNTER — Encounter: Payer: Medicaid Other | Admitting: Obstetrics and Gynecology

## 2022-11-30 ENCOUNTER — Telehealth: Payer: Self-pay | Admitting: Obstetrics and Gynecology

## 2022-11-30 NOTE — Telephone Encounter (Signed)
Patient called after missing her appointment; stated that she can't get up early with a newborn.  The baby has her days and nights mixed up.  She also stated that she was fine and she would just come in on 12/13/2022 for her 6 week postpartum appointment

## 2022-12-13 ENCOUNTER — Telehealth: Payer: Self-pay | Admitting: Obstetrics

## 2022-12-13 ENCOUNTER — Ambulatory Visit: Payer: Medicaid Other | Admitting: Obstetrics

## 2022-12-13 DIAGNOSIS — Z9889 Other specified postprocedural states: Secondary | ICD-10-CM

## 2022-12-13 NOTE — Telephone Encounter (Signed)
Reached out to pt to reschedule 6 week PP visit that was scheduled on 12/13/2022 at 1:35 with Missy.  Left message for pt to call back to reschedule.

## 2022-12-14 ENCOUNTER — Encounter: Payer: Self-pay | Admitting: Obstetrics and Gynecology

## 2022-12-14 NOTE — Telephone Encounter (Signed)
I contacted the patient via phone. I left message for the patient to call back for rescheduling. 

## 2022-12-14 NOTE — Telephone Encounter (Signed)
Missed Appointment letter sent via My Chart.

## 2024-04-23 ENCOUNTER — Encounter: Payer: Self-pay | Admitting: Emergency Medicine

## 2024-04-23 ENCOUNTER — Emergency Department
Admission: EM | Admit: 2024-04-23 | Discharge: 2024-04-23 | Disposition: A | Discharge status: Home / Self Care | Payer: Medicare (Managed Care) | Attending: Emergency Medicine | Admitting: Emergency Medicine

## 2024-04-23 ENCOUNTER — Other Ambulatory Visit: Payer: Self-pay

## 2024-04-23 ENCOUNTER — Emergency Department: Payer: Medicare (Managed Care)

## 2024-04-23 DIAGNOSIS — R1031 Right lower quadrant pain: Secondary | ICD-10-CM | POA: Diagnosis not present

## 2024-04-23 DIAGNOSIS — R112 Nausea with vomiting, unspecified: Secondary | ICD-10-CM | POA: Diagnosis not present

## 2024-04-23 DIAGNOSIS — I10 Essential (primary) hypertension: Secondary | ICD-10-CM | POA: Diagnosis not present

## 2024-04-23 DIAGNOSIS — R109 Unspecified abdominal pain: Secondary | ICD-10-CM | POA: Diagnosis present

## 2024-04-23 DIAGNOSIS — N390 Urinary tract infection, site not specified: Secondary | ICD-10-CM

## 2024-04-23 DIAGNOSIS — R197 Diarrhea, unspecified: Secondary | ICD-10-CM | POA: Diagnosis not present

## 2024-04-23 LAB — URINALYSIS, COMPLETE (UACMP) WITH MICROSCOPIC
Bilirubin Urine: NEGATIVE
Glucose, UA: NEGATIVE mg/dL
Hgb urine dipstick: NEGATIVE
Ketones, ur: 5 mg/dL — AB
Leukocytes,Ua: NEGATIVE
Nitrite: NEGATIVE
Protein, ur: NEGATIVE mg/dL
Specific Gravity, Urine: 1.025 (ref 1.005–1.030)
pH: 6 (ref 5.0–8.0)

## 2024-04-23 LAB — COMPREHENSIVE METABOLIC PANEL WITH GFR
ALT: 24 U/L (ref 0–44)
AST: 20 U/L (ref 15–41)
Albumin: 4.1 g/dL (ref 3.5–5.0)
Alkaline Phosphatase: 84 U/L (ref 38–126)
Anion gap: 8 (ref 5–15)
BUN: 8 mg/dL (ref 6–20)
CO2: 25 mmol/L (ref 22–32)
Calcium: 9.4 mg/dL (ref 8.9–10.3)
Chloride: 104 mmol/L (ref 98–111)
Creatinine, Ser: 0.56 mg/dL (ref 0.44–1.00)
GFR, Estimated: >60 mL/min (ref >60)
Glucose, Bld: 95 mg/dL (ref 70–99)
Potassium: 4 mmol/L (ref 3.5–5.1)
Sodium: 137 mmol/L (ref 135–145)
Total Bilirubin: 0.5 mg/dL (ref 0.0–1.2)
Total Protein: 8.1 g/dL (ref 6.5–8.1)

## 2024-04-23 LAB — CBC
HCT: 43 % (ref 36.0–46.0)
Hemoglobin: 13.7 g/dL (ref 12.0–15.0)
MCH: 26.8 pg (ref 26.0–34.0)
MCHC: 31.9 g/dL (ref 30.0–36.0)
MCV: 84.1 fL (ref 80.0–100.0)
Platelets: 309 K/uL (ref 150–400)
RBC: 5.11 MIL/uL (ref 3.87–5.11)
RDW: 14.6 % (ref 11.5–15.5)
WBC: 10.2 K/uL (ref 4.0–10.5)
nRBC: 0 % (ref 0.0–0.2)

## 2024-04-23 LAB — POC URINE PREG, ED: Preg Test, Ur: NEGATIVE

## 2024-04-23 MED ORDER — CEPHALEXIN 500 MG PO CAPS
500.0000 mg | ORAL_CAPSULE | Freq: Once | ORAL | Status: AC
  Administered 2024-04-23: 500 mg via ORAL
  Filled 2024-04-23: qty 1

## 2024-04-23 MED ORDER — ONDANSETRON 4 MG PO TBDP: 4.0000 mg | ORAL_TABLET | Freq: Three times a day (TID) | ORAL | 0 refills | Status: AC | PRN

## 2024-04-23 MED ORDER — CEPHALEXIN 500 MG PO CAPS
500.0000 mg | ORAL_CAPSULE | Freq: Two times a day (BID) | ORAL | 0 refills | Status: AC
Start: 2024-04-23 — End: ?

## 2024-04-23 MED ORDER — ONDANSETRON HCL 4 MG/2ML IJ SOLN
4.0000 mg | Freq: Once | INTRAMUSCULAR | Status: AC
  Administered 2024-04-23: 4 mg via INTRAVENOUS
  Filled 2024-04-23: qty 2

## 2024-04-23 MED ORDER — SODIUM CHLORIDE 0.9 % IV BOLUS
1000.0000 mL | Freq: Once | INTRAVENOUS | Status: AC
  Administered 2024-04-23: 1000 mL via INTRAVENOUS

## 2024-04-23 MED ORDER — KETOROLAC TROMETHAMINE 30 MG/ML IJ SOLN
30.0000 mg | Freq: Once | INTRAMUSCULAR | Status: AC
  Administered 2024-04-23: 30 mg via INTRAVENOUS
  Filled 2024-04-23: qty 1

## 2024-04-23 NOTE — ED Notes (Signed)
 Alert, no loose stools in ED. Awaiting ct scan. Respirations nonlabored. Mental status at baseline.

## 2024-04-23 NOTE — Discharge Instructions (Addendum)
 Your workup today has shown a urinary tract infection.  Please take antibiotic as prescribed twice daily for the next 7 days starting tomorrow (04/24/2024).  Please take your nausea medication as needed as written.  Drink plenty of fluids.  Return to the emergency department if you are unable to keep down fluids or your antibiotics secondary to vomiting, you develop a fever, or any other symptom personally concerning to yourself.

## 2024-04-23 NOTE — ED Provider Notes (Signed)
 [facility] Provider Note    Event Date/Time   First MD Initiated Contact with Patient 04/23/24 1622     (approximate)  History   Chief Complaint: Abdominal Pain, Emesis, and Diarrhea  HPI  Robin Hughes is a 38 y.o. female with a past Eckel history of cerebral palsy, hypertension, presents to the emergency department for abdominal pain nausea vomiting and diarrhea.  According to the patient since yesterday she has been experiencing pain in the abdomen associate with nausea vomiting diarrhea.  Abdominal pain is mostly right-sided but extends suprapubically as well.  States yesterday noticed a small amount of blood in her urine and has had a slight amount of discomfort when urinating as well.  No fever.  Physical Exam   Triage Vital Signs: ED Triage Vitals [04/23/24 1616]  Encounter Vitals Group     BP (!) 164/100     Girls Systolic BP Percentile      Girls Diastolic BP Percentile      Boys Systolic BP Percentile      Boys Diastolic BP Percentile      Pulse Rate 90     Resp 18     Temp 98.4 F (36.9 C)     Temp Source Oral     SpO2 98 %     Weight 145 lb (65.8 kg)     Height 4' 11 (1.499 m)     Head Circumference      Peak Flow      Pain Score 7     Pain Loc      Pain Education      Exclude from Growth Chart     Most recent vital signs: Vitals:   04/23/24 1616 04/23/24 1630  BP: (!) 164/100 (!) 114/104  Pulse: 90 (!) 118  Resp: 18   Temp: 98.4 F (36.9 C)   SpO2: 98% 98%    General: Awake, no distress.  CV:  Good peripheral perfusion.  Regular rate and rhythm  Resp:  Normal effort.  Equal breath sounds bilaterally.  Abd:  No distention.  Soft, mild right lower quadrant and suprapubic tenderness.  ED Results / Procedures / Treatments   RADIOLOGY  I have reviewed interpret the CT images.  No obvious obstruction or significant abnormality seen on my evaluation. Radiologist read the CT scan is negative for acute  process.   MEDICATIONS ORDERED IN ED: Medications  sodium chloride  0.9 % bolus 1,000 mL (has no administration in time range)  ondansetron  (ZOFRAN ) injection 4 mg (has no administration in time range)  ketorolac  (TORADOL ) 30 MG/ML injection 30 mg (has no administration in time range)     IMPRESSION / MDM / ASSESSMENT AND PLAN / ED COURSE  I reviewed the triage vital signs and the nursing notes.  Patient's presentation is most consistent with acute presentation with potential threat to life or bodily function.  Patient presents emergency department for abdominal pain nausea vomiting diarrhea.  Mild right lower quadrant suprapubic tenderness palpation otherwise benign abdomen.  Will IV hydrate, treat pain with Toradol , Zofran  for nausea.  Will check labs and obtain a CT renal scan to evaluate for possible ureterolithiasis.  Differential would also include UTI, Pilo nephritis, appendicitis among other intra-abdominal pathology.  Patient agreeable to plan of care.  Patient's workup today shows a reassuring CBC with a normal white blood cell count reassuring chemistry.  Urinalysis does show many bacteria concerning for likely urinary tract infection given the patient's intermittent dysuria and  hematuria she had been seeing.  We will place the patient on a nausea medication as well as an antibiotic.  Overall patient appears well reassuring vitals, reassuring labs and exam.  Patient agreeable to plan.  A urine culture has been sent as a precaution.  FINAL CLINICAL IMPRESSION(S) / ED DIAGNOSES   Abdominal pain Nausea vomiting diarrhea  Note:  This document was prepared using Dragon voice recognition software and may include unintentional dictation errors.   Dorothyann Drivers, MD 04/23/24 2032

## 2024-04-23 NOTE — ED Triage Notes (Signed)
 Pt reports abdominal pain, back pain, n/v/d since yesterday.  States she has been unable to eat.  States pain is sharp.

## 2024-04-24 LAB — URINE CULTURE

## 2024-08-26 ENCOUNTER — Ambulatory Visit: Payer: Medicare (Managed Care) | Admitting: Physician Assistant
